# Patient Record
Sex: Female | Born: 1965 | Race: White | Hispanic: No | Smoking: Current every day smoker
Health system: Southern US, Community
[De-identification: ages and names within clinical notes are randomized; demographics above are authoritative.]

## PROBLEM LIST (undated history)

## (undated) DIAGNOSIS — E079 Disorder of thyroid, unspecified: Secondary | ICD-10-CM

## (undated) DIAGNOSIS — Z973 Presence of spectacles and contact lenses: Secondary | ICD-10-CM

## (undated) HISTORY — PX: CRYOTHERAPY: SHX1416

## (undated) HISTORY — PX: COLONOSCOPY: SHX174

## (undated) HISTORY — DX: Disorder of thyroid, unspecified: E07.9

---

## 2010-11-06 ENCOUNTER — Ambulatory Visit: Payer: Self-pay | Admitting: Internal Medicine

## 2011-11-05 ENCOUNTER — Ambulatory Visit: Payer: Self-pay | Admitting: Otolaryngology

## 2011-11-05 LAB — T4, FREE: Free Thyroxine: 0.87 ng/dL (ref 0.76–1.46)

## 2011-11-06 ENCOUNTER — Ambulatory Visit: Payer: Self-pay | Admitting: Otolaryngology

## 2012-02-03 ENCOUNTER — Ambulatory Visit: Payer: Self-pay | Admitting: Otolaryngology

## 2012-02-03 LAB — T4, FREE: Free Thyroxine: 0.98 ng/dL (ref 0.76–1.46)

## 2012-05-12 ENCOUNTER — Ambulatory Visit: Payer: Self-pay | Admitting: Otolaryngology

## 2012-05-12 LAB — TSH: Thyroid Stimulating Horm: 12.8 u[IU]/mL — ABNORMAL HIGH

## 2012-05-12 LAB — T4, FREE: Free Thyroxine: 1.24 ng/dL (ref 0.76–1.46)

## 2012-06-24 ENCOUNTER — Ambulatory Visit: Payer: Self-pay | Admitting: Otolaryngology

## 2012-06-24 LAB — T4, FREE: Free Thyroxine: 1.21 ng/dL (ref 0.76–1.46)

## 2012-08-25 ENCOUNTER — Ambulatory Visit: Payer: Self-pay | Admitting: Otolaryngology

## 2012-08-25 LAB — T4, FREE: Free Thyroxine: 1.37 ng/dL (ref 0.76–1.46)

## 2012-08-25 LAB — TSH: Thyroid Stimulating Horm: 1.15 u[IU]/mL

## 2013-03-10 ENCOUNTER — Ambulatory Visit: Payer: Self-pay | Admitting: Otolaryngology

## 2013-03-10 LAB — T4, FREE: Free Thyroxine: 1.17 ng/dL (ref 0.76–1.46)

## 2013-06-15 ENCOUNTER — Ambulatory Visit: Payer: Self-pay | Admitting: Otolaryngology

## 2013-06-15 LAB — TSH: Thyroid Stimulating Horm: 7.9 u[IU]/mL — ABNORMAL HIGH

## 2013-06-15 LAB — T4, FREE: Free Thyroxine: 1.17 ng/dL (ref 0.76–1.46)

## 2013-09-06 IMAGING — US US THYROID
1 series · 17 of 25 positions shown · non-contrast
Comparison: none

REASON FOR EXAM: thyroid nodule
COMMENTS:

[Series 1: us thyroid · 17 of 55 slices shown]
[im 1/55]
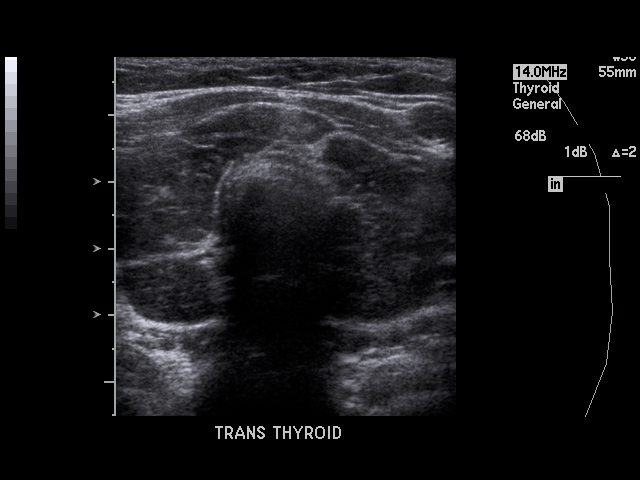
[im 5/55]
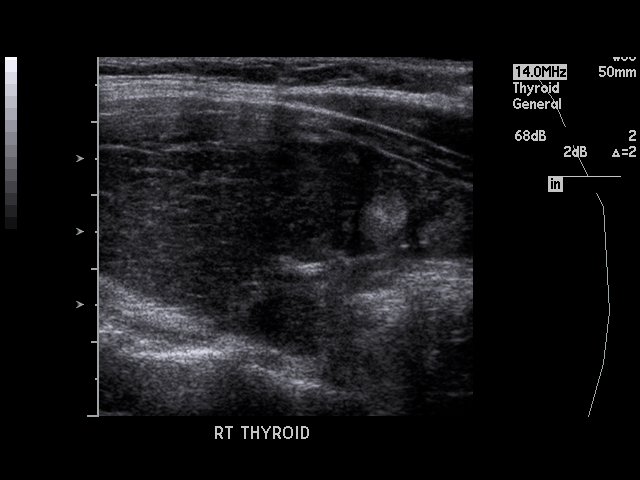
[im 7/55]
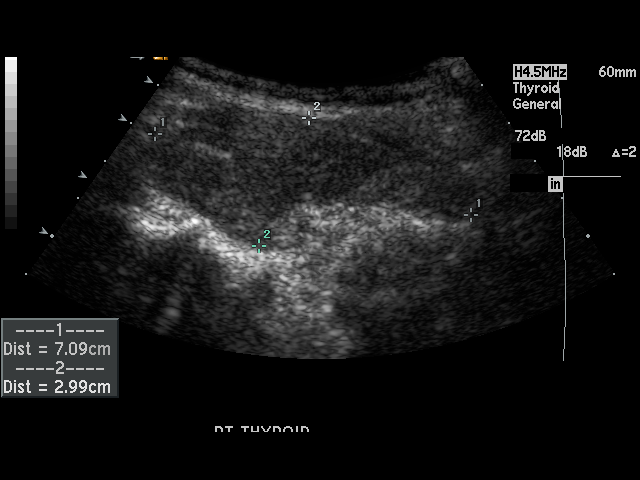
[im 12/55]
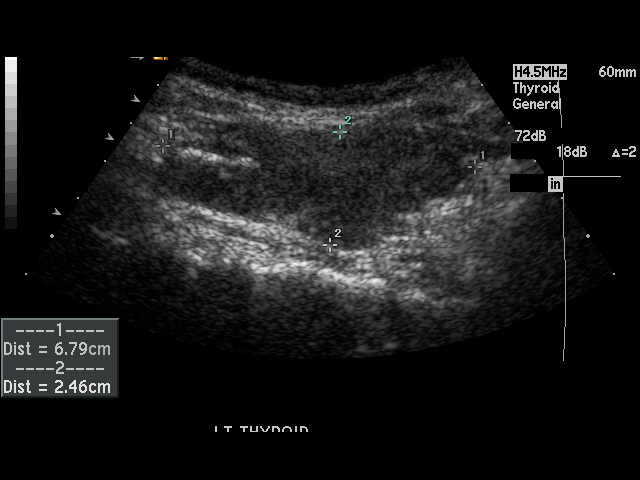
[im 14/55]
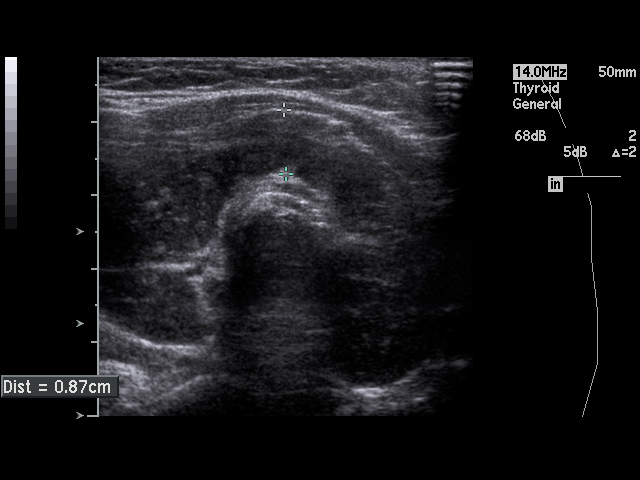
[im 19/55]
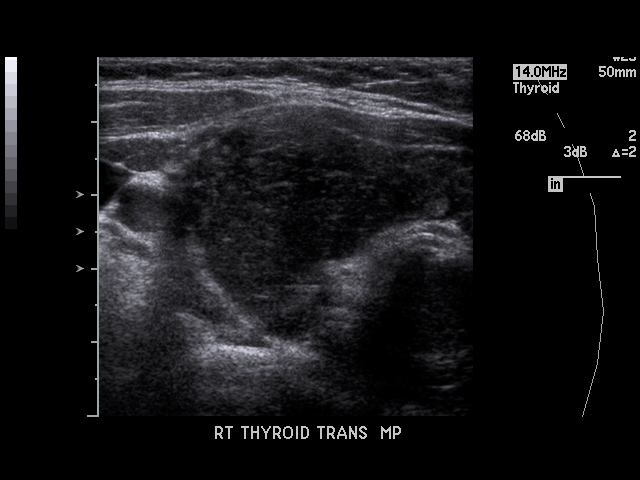
[im 21/55]
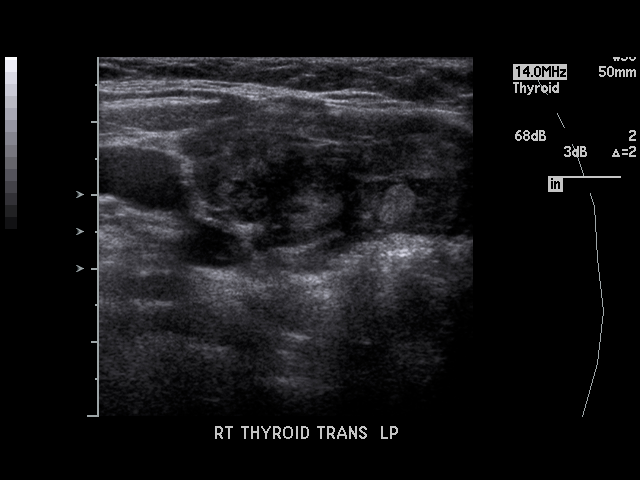
[im 25/55]
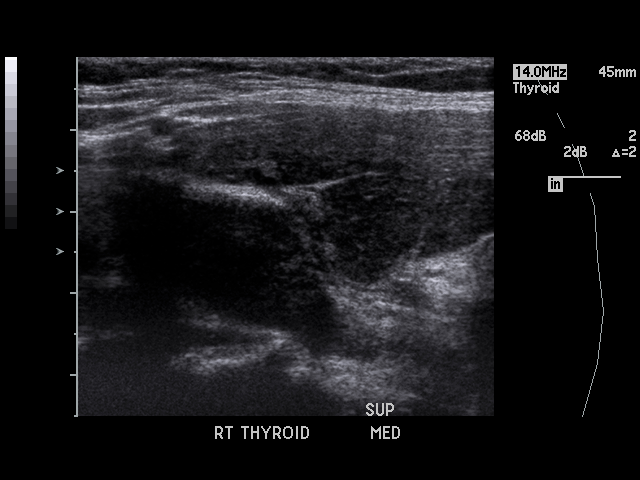
[im 28/55]
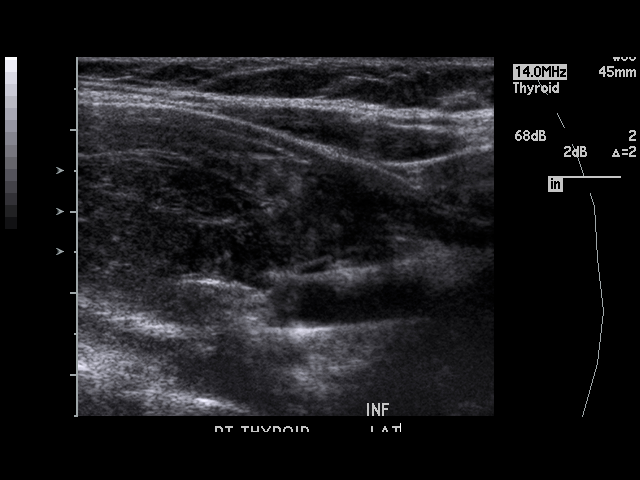
[im 30/55]
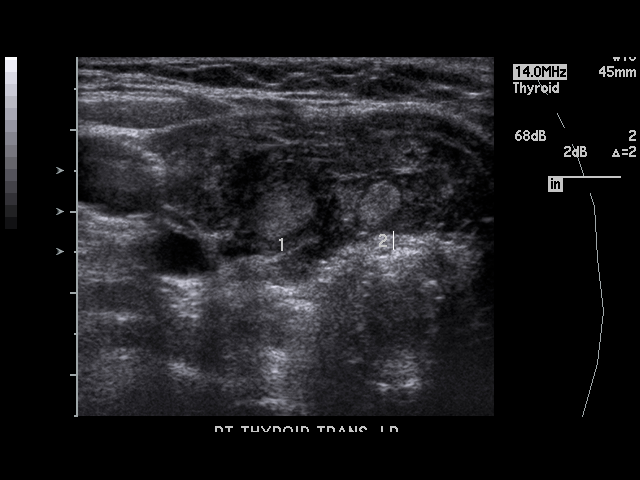
[im 34/55]
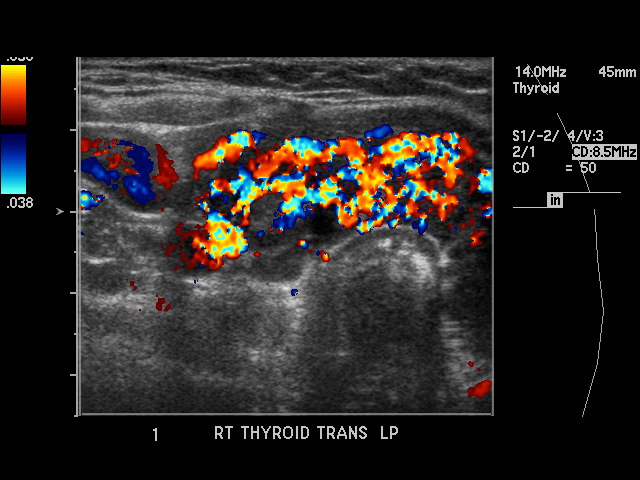
[im 37/55]
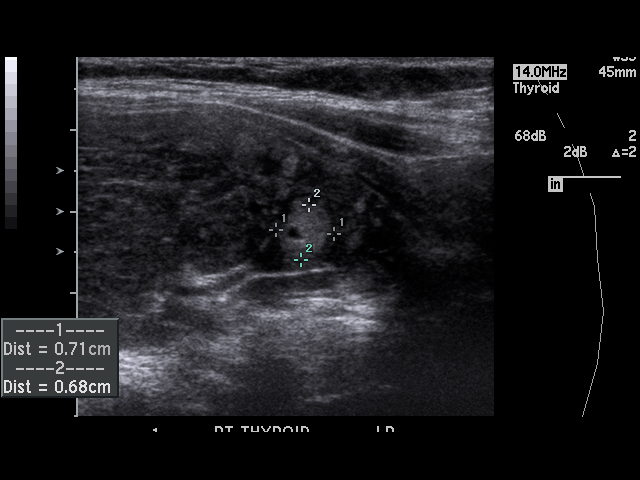
[im 41/55]
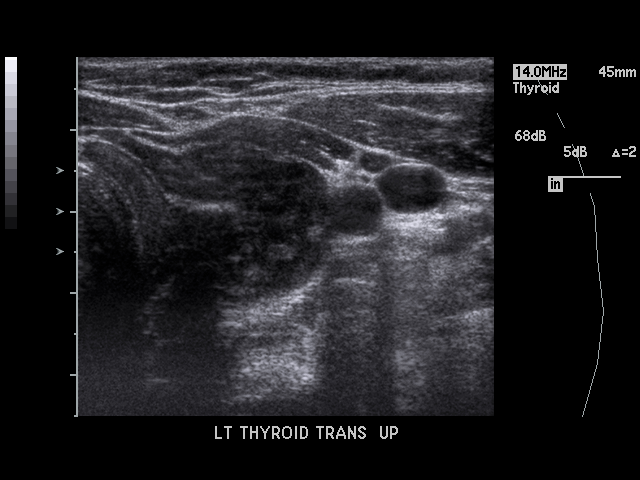
[im 43/55]
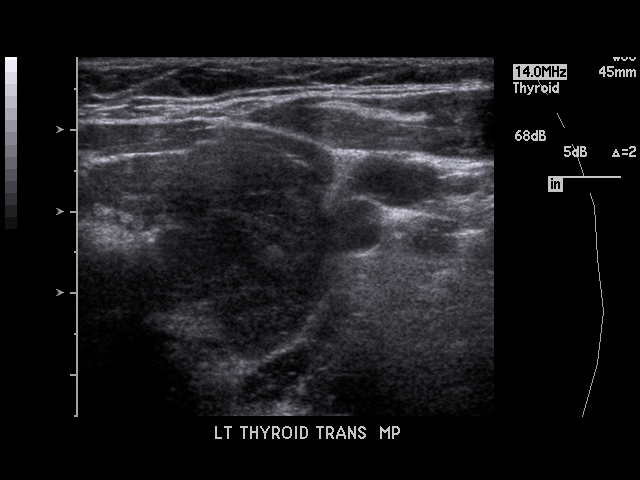
[im 48/55]
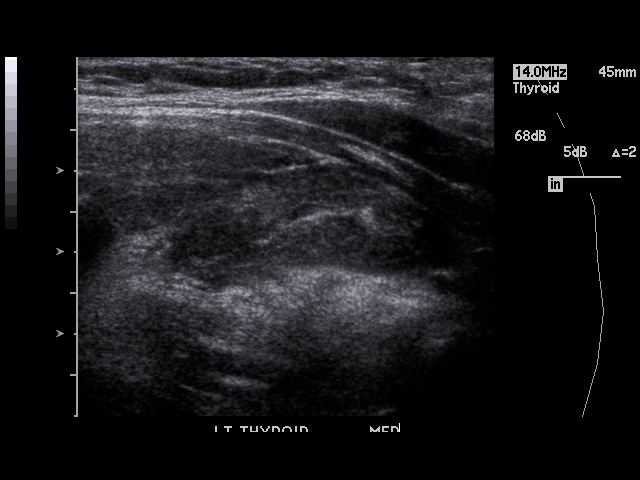
[im 50/55]
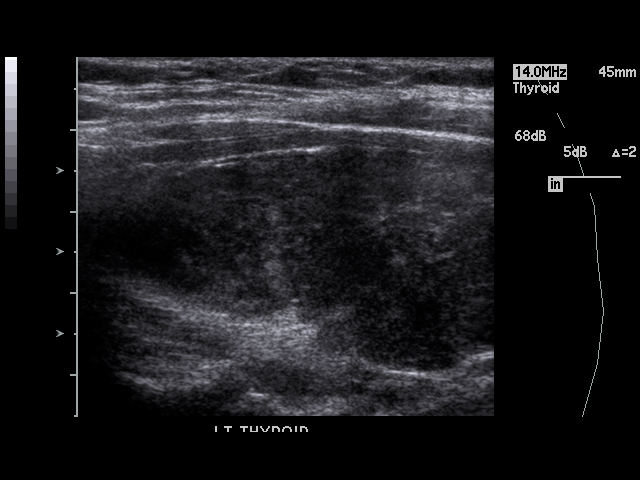
[im 55/55]
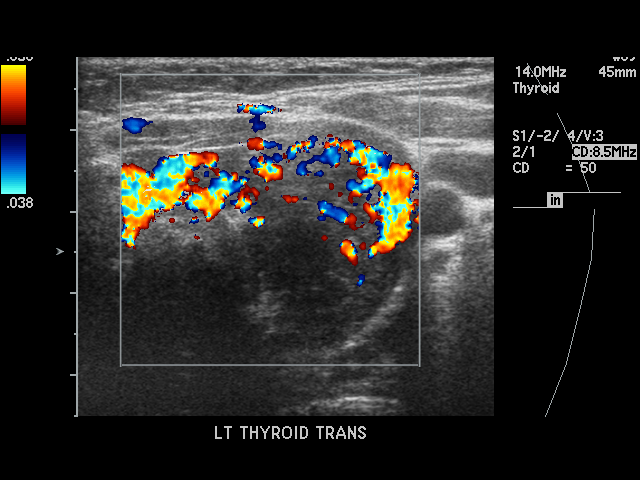

[17 of 25 positions shown; findings below may reference images not displayed]

PROCEDURE:     PAULO CARLOS - PAULO CARLOS THYROID  - November 06, 2011  [DATE]

RESULT:     Ultrasound of the thyroid demonstrates an enlarged, slightly
hypoechoic, heterogeneous hypervascular gland. The right lobe contains
multiple more hyperechoic nodules than the surrounding parenchyma. The
largest is in the lower pole of the right lobe and has a slightly hypoechoic
center. The largest nodule measures 0.71 x 0.68 x 0.90 cm. The isthmus is
hypervascular without a discrete mass. The left lobe is also hypervascular
and heterogeneous. No simple cysts are seen. The right lobe measures 7.09 x
2.99 x 2.68 cm. The left lobe measures 6.79 x 2.48 x 1.88 cm. The isthmus
shows an anterior to posterior dimension of 8.7 mm.
IMPRESSION: Abnormal thyroid ultrasound with thyromegaly and
hypervascularity diffusely which could represent thyroiditis. Nodular
densities in the right lobe are present with the largest in the lower pole
region as discussed above. Clinical and laboratory correlation is
recommended. No definite left thyroid mass is seen.

## 2013-09-22 ENCOUNTER — Ambulatory Visit: Payer: Self-pay | Admitting: Otolaryngology

## 2013-09-22 LAB — TSH: Thyroid Stimulating Horm: 3.35 u[IU]/mL

## 2013-09-22 LAB — T4, FREE: Free Thyroxine: 1.38 ng/dL (ref 0.76–1.46)

## 2013-12-31 ENCOUNTER — Ambulatory Visit: Payer: Self-pay | Admitting: Otolaryngology

## 2013-12-31 LAB — T4, FREE: Free Thyroxine: 1.57 ng/dL — ABNORMAL HIGH (ref 0.76–1.46)

## 2013-12-31 LAB — TSH: Thyroid Stimulating Horm: 0.105 u[IU]/mL — ABNORMAL LOW

## 2014-06-28 ENCOUNTER — Ambulatory Visit: Payer: Self-pay | Admitting: Otolaryngology

## 2014-06-28 LAB — T4, FREE: Free Thyroxine: 1.88 ng/dL — ABNORMAL HIGH (ref 0.76–1.46)

## 2014-06-28 LAB — TSH: Thyroid Stimulating Horm: 0.017 u[IU]/mL — ABNORMAL LOW

## 2014-07-11 ENCOUNTER — Ambulatory Visit: Payer: Self-pay | Admitting: Physician Assistant

## 2014-09-09 ENCOUNTER — Ambulatory Visit: Payer: Self-pay | Admitting: Otolaryngology

## 2014-09-09 LAB — T4, FREE: Free Thyroxine: 1.55 ng/dL — ABNORMAL HIGH (ref 0.76–1.46)

## 2014-09-09 LAB — TSH: THYROID STIMULATING HORM: 1.04 u[IU]/mL

## 2015-01-26 ENCOUNTER — Ambulatory Visit
Admit: 2015-01-26 | Disposition: A | Discharge status: Home / Self Care | Payer: Self-pay | Attending: Otolaryngology | Admitting: Otolaryngology

## 2015-01-26 LAB — TSH: Thyroid Stimulating Horm: 0.112 u[IU]/mL — ABNORMAL LOW

## 2015-01-26 LAB — T4, FREE: Free Thyroxine: 1.61 ng/dL — ABNORMAL HIGH

## 2015-05-29 ENCOUNTER — Other Ambulatory Visit
Admission: RE | Admit: 2015-05-29 | Discharge: 2015-05-29 | Disposition: A | Discharge status: Home / Self Care | Payer: BLUE CROSS/BLUE SHIELD | Source: Ambulatory Visit | Attending: Otolaryngology | Admitting: Otolaryngology

## 2015-05-29 DIAGNOSIS — E039 Hypothyroidism, unspecified: Secondary | ICD-10-CM | POA: Diagnosis not present

## 2015-05-29 DIAGNOSIS — E041 Nontoxic single thyroid nodule: Secondary | ICD-10-CM | POA: Diagnosis present

## 2015-05-29 LAB — T4, FREE: FREE T4: 1.41 ng/dL — AB (ref 0.61–1.12)

## 2015-05-29 LAB — TSH: TSH: 0.288 u[IU]/mL — ABNORMAL LOW (ref 0.350–4.500)

## 2015-05-30 LAB — T3, FREE: T3 FREE: 2.8 pg/mL (ref 2.0–4.4)

## 2016-02-09 ENCOUNTER — Other Ambulatory Visit
Admission: RE | Admit: 2016-02-09 | Discharge: 2016-02-09 | Disposition: A | Discharge status: Home / Self Care | Payer: BLUE CROSS/BLUE SHIELD | Source: Ambulatory Visit | Attending: Otolaryngology | Admitting: Otolaryngology

## 2016-02-09 DIAGNOSIS — E039 Hypothyroidism, unspecified: Secondary | ICD-10-CM | POA: Insufficient documentation

## 2016-02-09 LAB — T4, FREE: Free T4: 0.79 ng/dL (ref 0.61–1.12)

## 2016-02-09 LAB — TSH: TSH: 42.235 u[IU]/mL — AB (ref 0.350–4.500)

## 2016-02-10 LAB — T3, FREE: T3, Free: 1.9 pg/mL — ABNORMAL LOW (ref 2.0–4.4)

## 2016-05-08 ENCOUNTER — Other Ambulatory Visit
Admission: RE | Admit: 2016-05-08 | Discharge: 2016-05-08 | Disposition: A | Discharge status: Home / Self Care | Payer: BLUE CROSS/BLUE SHIELD | Source: Ambulatory Visit | Attending: Otolaryngology | Admitting: Otolaryngology

## 2016-05-08 DIAGNOSIS — E039 Hypothyroidism, unspecified: Secondary | ICD-10-CM | POA: Diagnosis not present

## 2016-05-08 LAB — TSH: TSH: 4.374 u[IU]/mL (ref 0.350–4.500)

## 2016-05-08 LAB — T4, FREE: FREE T4: 1.21 ng/dL — AB (ref 0.61–1.12)

## 2016-05-09 LAB — T3, FREE: T3 FREE: 2.4 pg/mL (ref 2.0–4.4)

## 2016-10-31 DIAGNOSIS — E039 Hypothyroidism, unspecified: Secondary | ICD-10-CM | POA: Diagnosis not present

## 2016-11-13 DIAGNOSIS — T169XXA Foreign body in ear, unspecified ear, initial encounter: Secondary | ICD-10-CM | POA: Diagnosis not present

## 2016-11-13 DIAGNOSIS — E039 Hypothyroidism, unspecified: Secondary | ICD-10-CM | POA: Diagnosis not present

## 2017-03-18 DIAGNOSIS — E039 Hypothyroidism, unspecified: Secondary | ICD-10-CM | POA: Diagnosis not present

## 2017-03-26 DIAGNOSIS — E039 Hypothyroidism, unspecified: Secondary | ICD-10-CM | POA: Diagnosis not present

## 2017-06-24 DIAGNOSIS — E039 Hypothyroidism, unspecified: Secondary | ICD-10-CM | POA: Diagnosis not present

## 2017-06-26 DIAGNOSIS — E039 Hypothyroidism, unspecified: Secondary | ICD-10-CM | POA: Diagnosis not present

## 2017-07-31 DIAGNOSIS — Z23 Encounter for immunization: Secondary | ICD-10-CM | POA: Diagnosis not present

## 2017-09-25 DIAGNOSIS — E039 Hypothyroidism, unspecified: Secondary | ICD-10-CM | POA: Diagnosis not present

## 2017-10-01 ENCOUNTER — Other Ambulatory Visit: Payer: Self-pay | Admitting: Otolaryngology

## 2017-10-01 DIAGNOSIS — E041 Nontoxic single thyroid nodule: Secondary | ICD-10-CM

## 2017-10-01 DIAGNOSIS — E039 Hypothyroidism, unspecified: Secondary | ICD-10-CM | POA: Diagnosis not present

## 2017-12-22 ENCOUNTER — Ambulatory Visit
Admission: RE | Admit: 2017-12-22 | Discharge: 2017-12-22 | Disposition: A | Discharge status: Home / Self Care | Payer: BLUE CROSS/BLUE SHIELD | Source: Ambulatory Visit | Attending: Otolaryngology | Admitting: Otolaryngology

## 2017-12-22 DIAGNOSIS — E010 Iodine-deficiency related diffuse (endemic) goiter: Secondary | ICD-10-CM | POA: Diagnosis not present

## 2017-12-22 DIAGNOSIS — E041 Nontoxic single thyroid nodule: Secondary | ICD-10-CM

## 2017-12-24 DIAGNOSIS — E041 Nontoxic single thyroid nodule: Secondary | ICD-10-CM | POA: Diagnosis not present

## 2017-12-30 DIAGNOSIS — E041 Nontoxic single thyroid nodule: Secondary | ICD-10-CM | POA: Diagnosis not present

## 2017-12-30 DIAGNOSIS — E039 Hypothyroidism, unspecified: Secondary | ICD-10-CM | POA: Diagnosis not present

## 2018-04-02 DIAGNOSIS — E039 Hypothyroidism, unspecified: Secondary | ICD-10-CM | POA: Diagnosis not present

## 2018-04-08 DIAGNOSIS — E039 Hypothyroidism, unspecified: Secondary | ICD-10-CM | POA: Diagnosis not present

## 2018-04-08 DIAGNOSIS — E041 Nontoxic single thyroid nodule: Secondary | ICD-10-CM | POA: Diagnosis not present

## 2018-07-10 DIAGNOSIS — Z1231 Encounter for screening mammogram for malignant neoplasm of breast: Secondary | ICD-10-CM | POA: Diagnosis not present

## 2018-07-31 DIAGNOSIS — Z23 Encounter for immunization: Secondary | ICD-10-CM | POA: Diagnosis not present

## 2018-08-03 ENCOUNTER — Other Ambulatory Visit: Payer: Self-pay | Admitting: Obstetrics & Gynecology

## 2018-08-03 NOTE — Progress Notes (Signed)
MMG reviewed from [facility], normal (done 07/10/18) Barnett Applebaum, MD, [facility], [facility] 08/03/2018  5:21 PM

## 2018-08-04 ENCOUNTER — Telehealth: Payer: Self-pay | Admitting: Obstetrics & Gynecology

## 2018-08-04 NOTE — Telephone Encounter (Signed)
-----   Message from Gae Dry, MD sent at 08/03/2018  5:20 PM EDT ----- Regarding: Annual [place] please

## 2018-08-04 NOTE — Telephone Encounter (Signed)
Spoke with patient to schedule annual. Patient is wanting to check with her work schedule and call back to be schedule.

## 2018-09-16 ENCOUNTER — Encounter: Payer: Self-pay | Admitting: Obstetrics & Gynecology

## 2018-09-16 ENCOUNTER — Ambulatory Visit (INDEPENDENT_AMBULATORY_CARE_PROVIDER_SITE_OTHER): Payer: BLUE CROSS/BLUE SHIELD | Admitting: Obstetrics & Gynecology

## 2018-09-16 VITALS — BP 120/80 | Ht 69.0 in | Wt 228.0 lb

## 2018-09-16 DIAGNOSIS — Z1321 Encounter for screening for nutritional disorder: Secondary | ICD-10-CM | POA: Diagnosis not present

## 2018-09-16 DIAGNOSIS — Z13220 Encounter for screening for lipoid disorders: Secondary | ICD-10-CM

## 2018-09-16 DIAGNOSIS — Z01419 Encounter for gynecological examination (general) (routine) without abnormal findings: Secondary | ICD-10-CM

## 2018-09-16 DIAGNOSIS — Z1211 Encounter for screening for malignant neoplasm of colon: Secondary | ICD-10-CM

## 2018-09-16 DIAGNOSIS — Z0000 Encounter for general adult medical examination without abnormal findings: Secondary | ICD-10-CM

## 2018-09-16 DIAGNOSIS — Z131 Encounter for screening for diabetes mellitus: Secondary | ICD-10-CM

## 2018-09-16 NOTE — Patient Instructions (Signed)
PAP every three years Mammogram every year Colonoscopy every 5 years Labs yearly

## 2018-09-16 NOTE — Progress Notes (Signed)
HPI:      [name] is a 52 y.o. G1P1001 who LMP was in the past, she presents today for her annual examination.  The patient has no complaints today. The patient is sexually active. Herlast pap: approximate date 2017 and was normal and last mammogram: approximate date 06/2018 and was normal.  The patient does perform self breast exams.  There is no notable family history of breast or ovarian cancer in her family. The patient is not taking hormone replacement therapy. Patient denies post-menopausal vaginal bleeding.   The patient has regular exercise: yes. The patient denies current symptoms of depression.    GYN Hx: Last Colonoscopy:5 years ago. Polyps.   PMHx: Past Medical History:  Diagnosis Date  . Thyroid disease    Past Surgical History:  Procedure Laterality Date  . CRYOTHERAPY     Family History  Problem Relation Age of Onset  . Hypertension Maternal Grandfather   . Diabetes Maternal Grandfather   . Cancer Paternal Grandmother    Social History   Tobacco Use  . Smoking status: Current Every Day Smoker  . Smokeless tobacco: Never Used  Substance Use Topics  . Alcohol use: Yes  . Drug use: Never    Current Outpatient Medications:  .  levothyroxine (SYNTHROID, LEVOTHROID) 125 MCG tablet, TAKE ONE TABLET EVERY OTHER DAY (ALTERNATING WITH LEVOTHYROXINE 137 MCG), Disp: , Rfl: 1 Allergies: Patient has no known allergies.  Review of Systems  Constitutional: Negative for chills, fever and malaise/fatigue.  HENT: Negative for congestion, sinus pain and sore throat.   Eyes: Negative for blurred vision and pain.  Respiratory: Negative for cough and wheezing.   Cardiovascular: Negative for chest pain and leg swelling.  Gastrointestinal: Negative for abdominal pain, constipation, diarrhea, heartburn, nausea and vomiting.  Genitourinary: Negative for dysuria, frequency, hematuria and urgency.  Musculoskeletal: Negative for back pain, joint pain, myalgias and neck  pain.  Skin: Negative for itching and rash.  Neurological: Negative for dizziness, tremors and weakness.  Endo/Heme/Allergies: Does not bruise/bleed easily.  Psychiatric/Behavioral: Negative for depression. The patient is not nervous/anxious and does not have insomnia.     Objective: BP 120/80   Ht 5\' 9"  (1.753 m)   Wt 228 lb (103.4 kg)   BMI 33.67 kg/m   Filed Weights   09/16/18 0915  Weight: 228 lb (103.4 kg)   Body mass index is 33.67 kg/m. Physical Exam  Constitutional: She is oriented to person, place, and time. She appears well-developed and well-nourished. No distress.  Genitourinary: Rectum normal, vagina normal and uterus normal. Pelvic exam was performed with patient supine. There is no rash or lesion on the right labia. There is no rash or lesion on the left labia. Vagina exhibits no lesion. No bleeding in the vagina. Right adnexum does not display mass and does not display tenderness. Left adnexum does not display mass and does not display tenderness. Cervix does not exhibit motion tenderness, lesion, friability or polyp.   Uterus is mobile and midaxial. Uterus is not enlarged or exhibiting a mass.  HENT:  Head: Normocephalic and atraumatic. Head is without laceration.  Right Ear: Hearing normal.  Left Ear: Hearing normal.  Nose: No epistaxis.  No foreign bodies.  Mouth/Throat: Uvula is midline, oropharynx is clear and moist and mucous membranes are normal.  Eyes: Pupils are equal, round, and reactive to light.  Neck: Normal range of motion. Neck supple. No thyromegaly present.  Cardiovascular: Normal rate and regular rhythm. Exam reveals no gallop and  no friction rub.  No murmur heard. Pulmonary/Chest: Effort normal and breath sounds normal. No respiratory distress. She has no wheezes. Right breast exhibits no mass, no skin change and no tenderness. Left breast exhibits no mass, no skin change and no tenderness.  Abdominal: Soft. Bowel sounds are normal. She exhibits no  distension. There is no tenderness. There is no rebound.  Musculoskeletal: Normal range of motion.  Neurological: She is alert and oriented to person, place, and time. No cranial nerve deficit.  Skin: Skin is warm and dry.  Psychiatric: She has a normal mood and affect. Judgment normal.  Vitals reviewed.  Assessment: Annual Exam 1. Annual physical exam   2. Screening for diabetes mellitus   3. Screening for cholesterol level   4. Encounter for vitamin deficiency screening   5. Screen for colon cancer    Plan:            1.  Cervical Screening-  Pap smear schedule reviewed with patient, due 2020  2. Breast screening- Exam annually and mammogram UTD  3. Colonoscopy every 5 years due to polyp history w colonoscopy for rectal bleeding 4-5 years ago.  Will arrange consult  4. Labs Ordered today  5. Counseling for hormonal therapy: no change in therapy today  6. Flu shot UTD    F/U  Return in about 1 year (around 09/17/2019) for Annual.  Barnett Applebaum, MD, [facility], [facility] 09/16/2018  9:39 AM

## 2018-09-17 LAB — LIPID PANEL
CHOL/HDL RATIO: 3.9 ratio (ref 0.0–4.4)
CHOLESTEROL TOTAL: 199 mg/dL (ref 100–199)
HDL: 51 mg/dL (ref >39)
LDL Calculated: 112 mg/dL — ABNORMAL HIGH (ref 0–99)
TRIGLYCERIDES: 178 mg/dL — AB (ref 0–149)
VLDL CHOLESTEROL CAL: 36 mg/dL (ref 5–40)

## 2018-09-17 LAB — VITAMIN D 25 HYDROXY (VIT D DEFICIENCY, FRACTURES): Vit D, 25-Hydroxy: 24.9 ng/mL — ABNORMAL LOW (ref 30.0–100.0)

## 2018-09-17 LAB — HEMOGLOBIN A1C
Est. average glucose Bld gHb Est-mCnc: 128 mg/dL
Hgb A1c MFr Bld: 6.1 % — ABNORMAL HIGH (ref 4.8–5.6)

## 2018-09-19 ENCOUNTER — Encounter: Payer: Self-pay | Admitting: Obstetrics & Gynecology

## 2018-09-29 ENCOUNTER — Other Ambulatory Visit: Payer: Self-pay

## 2018-09-29 DIAGNOSIS — Z1211 Encounter for screening for malignant neoplasm of colon: Secondary | ICD-10-CM

## 2018-10-05 ENCOUNTER — Encounter: Payer: Self-pay | Admitting: *Deleted

## 2018-10-05 ENCOUNTER — Other Ambulatory Visit: Payer: Self-pay

## 2018-10-09 NOTE — Discharge Instructions (Signed)
General Anesthesia, Adult, Care After  This sheet gives you information about how to care for yourself after your procedure. Your health care provider may also give you more specific instructions. If you have problems or questions, contact your health care provider.  What can I expect after the procedure?  After the procedure, the following side effects are common:  Pain or discomfort at the IV site.  Nausea.  Vomiting.  Sore throat.  Trouble concentrating.  Feeling cold or chills.  Weak or tired.  Sleepiness and fatigue.  Soreness and body aches. These side effects can affect parts of the body that were not involved in surgery.  Follow these instructions at home:    For at least 24 hours after the procedure:  Have a responsible adult stay with you. It is important to have someone help care for you until you are awake and alert.  Rest as needed.  Do not:  Participate in activities in which you could fall or become injured.  Drive.  Use heavy machinery.  Drink alcohol.  Take sleeping pills or medicines that cause drowsiness.  Make important decisions or sign legal documents.  Take care of children on your own.  Eating and drinking  Follow any instructions from your health care provider about eating or drinking restrictions.  When you feel hungry, start by eating small amounts of foods that are soft and easy to digest (bland), such as toast. Gradually return to your regular diet.  Drink enough fluid to keep your urine pale yellow.  If you vomit, rehydrate by drinking water, juice, or clear broth.  General instructions  If you have sleep apnea, surgery and certain medicines can increase your risk for breathing problems. Follow instructions from your health care provider about wearing your sleep device:  Anytime you are sleeping, including during daytime naps.  While taking prescription pain medicines, sleeping medicines, or medicines that make you drowsy.  Return to your normal activities as told by your health care  provider. Ask your health care provider what activities are safe for you.  Take over-the-counter and prescription medicines only as told by your health care provider.  If you smoke, do not smoke without supervision.  Keep all follow-up visits as told by your health care provider. This is important.  Contact a health care provider if:  You have nausea or vomiting that does not get better with medicine.  You cannot eat or drink without vomiting.  You have pain that does not get better with medicine.  You are unable to pass urine.  You develop a skin rash.  You have a fever.  You have redness around your IV site that gets worse.  Get help right away if:  You have difficulty breathing.  You have chest pain.  You have blood in your urine or stool, or you vomit blood.  Summary  After the procedure, it is common to have a sore throat or nausea. It is also common to feel tired.  Have a responsible adult stay with you for the first 24 hours after general anesthesia. It is important to have someone help care for you until you are awake and alert.  When you feel hungry, start by eating small amounts of foods that are soft and easy to digest (bland), such as toast. Gradually return to your regular diet.  Drink enough fluid to keep your urine pale yellow.  Return to your normal activities as told by your health care provider. Ask your health care   provider what activities are safe for you.  This information is not intended to replace advice given to you by your health care provider. Make sure you discuss any questions you have with your health care provider.  Document Released: 01/13/2001 Document Revised: 05/23/2017 Document Reviewed: 05/23/2017  Elsevier Interactive Patient Education  2019 [employer] Inc[employer]

## 2018-10-12 ENCOUNTER — Ambulatory Visit
Admission: RE | Admit: 2018-10-12 | Discharge: 2018-10-12 | Disposition: A | Discharge status: Home / Self Care | Payer: BLUE CROSS/BLUE SHIELD | Source: Ambulatory Visit | Attending: Gastroenterology | Admitting: Gastroenterology

## 2018-10-12 ENCOUNTER — Ambulatory Visit: Payer: BLUE CROSS/BLUE SHIELD | Admitting: Anesthesiology

## 2018-10-12 ENCOUNTER — Encounter
Admission: RE | Disposition: A | Discharge status: Home / Self Care | Payer: Self-pay | Source: Ambulatory Visit | Attending: Gastroenterology

## 2018-10-12 DIAGNOSIS — Z79899 Other long term (current) drug therapy: Secondary | ICD-10-CM | POA: Diagnosis not present

## 2018-10-12 DIAGNOSIS — D125 Benign neoplasm of sigmoid colon: Secondary | ICD-10-CM | POA: Diagnosis not present

## 2018-10-12 DIAGNOSIS — K5730 Diverticulosis of large intestine without perforation or abscess without bleeding: Secondary | ICD-10-CM | POA: Insufficient documentation

## 2018-10-12 DIAGNOSIS — Z860100 Personal history of colon polyps, unspecified: Secondary | ICD-10-CM

## 2018-10-12 DIAGNOSIS — E079 Disorder of thyroid, unspecified: Secondary | ICD-10-CM | POA: Diagnosis not present

## 2018-10-12 DIAGNOSIS — Z1211 Encounter for screening for malignant neoplasm of colon: Secondary | ICD-10-CM | POA: Insufficient documentation

## 2018-10-12 DIAGNOSIS — D124 Benign neoplasm of descending colon: Secondary | ICD-10-CM

## 2018-10-12 DIAGNOSIS — K635 Polyp of colon: Secondary | ICD-10-CM | POA: Diagnosis not present

## 2018-10-12 DIAGNOSIS — F17210 Nicotine dependence, cigarettes, uncomplicated: Secondary | ICD-10-CM | POA: Diagnosis not present

## 2018-10-12 HISTORY — PX: POLYPECTOMY: SHX5525

## 2018-10-12 HISTORY — DX: Presence of spectacles and contact lenses: Z97.3

## 2018-10-12 HISTORY — PX: COLONOSCOPY WITH PROPOFOL: SHX5780

## 2018-10-12 SURGERY — COLONOSCOPY WITH PROPOFOL
Anesthesia: General | Site: Rectum

## 2018-10-12 MED ORDER — LIDOCAINE HCL (CARDIAC) PF 100 MG/5ML IV SOSY
PREFILLED_SYRINGE | INTRAVENOUS | Status: DC | PRN
  Administered 2018-10-12: 40 mg via INTRAVENOUS

## 2018-10-12 MED ORDER — SODIUM CHLORIDE 0.9 % IV SOLN: INTRAVENOUS | Status: DC

## 2018-10-12 MED ORDER — LACTATED RINGERS IV SOLN
INTRAVENOUS | Status: DC
  Administered 2018-10-12: 08:00:00 via INTRAVENOUS

## 2018-10-12 MED ORDER — STERILE WATER FOR IRRIGATION IR SOLN
Status: DC | PRN
  Administered 2018-10-12: 10:00:00

## 2018-10-12 MED ORDER — OXYCODONE HCL 5 MG PO TABS: 5.0000 mg | ORAL_TABLET | Freq: Once | ORAL | Status: DC | PRN

## 2018-10-12 MED ORDER — OXYCODONE HCL 5 MG/5ML PO SOLN: 5.0000 mg | Freq: Once | ORAL | Status: DC | PRN

## 2018-10-12 MED ORDER — PROPOFOL 10 MG/ML IV BOLUS
INTRAVENOUS | Status: DC | PRN
  Administered 2018-10-12: 80 mg via INTRAVENOUS
  Administered 2018-10-12: 30 mg via INTRAVENOUS
  Administered 2018-10-12 (×2): 20 mg via INTRAVENOUS
  Administered 2018-10-12 (×2): 30 mg via INTRAVENOUS
  Administered 2018-10-12 (×3): 20 mg via INTRAVENOUS
  Administered 2018-10-12: 30 mg via INTRAVENOUS
  Administered 2018-10-12: 20 mg via INTRAVENOUS

## 2018-10-12 SURGICAL SUPPLY — 24 items
CANISTER SUCT 1200ML W/VALVE (MISCELLANEOUS) ×4 IMPLANT
CLIP HMST 235XBRD CATH ROT (MISCELLANEOUS) IMPLANT
CLIP RESOLUTION 360 11X235 (MISCELLANEOUS)
ELECT REM PT RETURN 9FT ADLT (ELECTROSURGICAL)
ELECTRODE REM PT RTRN 9FT ADLT (ELECTROSURGICAL) IMPLANT
FCP ESCP3.2XJMB 240X2.8X (MISCELLANEOUS)
FORCEPS BIOP RAD 4 LRG CAP 4 (CUTTING FORCEPS) IMPLANT
FORCEPS BIOP RJ4 240 W/NDL (MISCELLANEOUS)
FORCEPS ESCP3.2XJMB 240X2.8X (MISCELLANEOUS) IMPLANT
GOWN CVR UNV OPN BCK APRN NK (MISCELLANEOUS) ×4 IMPLANT
GOWN ISOL THUMB LOOP REG UNIV (MISCELLANEOUS) ×4
INJECTOR VARIJECT VIN23 (MISCELLANEOUS) IMPLANT
KIT DEFENDO VALVE AND CONN (KITS) IMPLANT
KIT ENDO PROCEDURE OLY (KITS) ×4 IMPLANT
MARKER SPOT ENDO TATTOO 5ML (MISCELLANEOUS) IMPLANT
PROBE APC STR FIRE (PROBE) IMPLANT
RETRIEVER NET ROTH 2.5X230 LF (MISCELLANEOUS) IMPLANT
SNARE SHORT THROW 13M SML OVAL (MISCELLANEOUS) ×4 IMPLANT
SNARE SHORT THROW 30M LRG OVAL (MISCELLANEOUS) IMPLANT
SNARE SNG USE RND 15MM (INSTRUMENTS) IMPLANT
SPOT EX ENDOSCOPIC TATTOO (MISCELLANEOUS)
TRAP ETRAP POLY (MISCELLANEOUS) ×4 IMPLANT
VARIJECT INJECTOR VIN23 (MISCELLANEOUS)
WATER STERILE IRR 250ML POUR (IV SOLUTION) ×4 IMPLANT

## 2018-10-12 NOTE — Anesthesia Postprocedure Evaluation (Signed)
Anesthesia Post Note  Patient: Catherine Stout  Procedure(s) Performed: COLONOSCOPY WITH PROPOFOL (N/A Rectum) POLYPECTOMY (N/A )  Patient location during evaluation: PACU Anesthesia Type: General Level of consciousness: awake and alert Pain management: pain level controlled Vital Signs Assessment: post-procedure vital signs reviewed and stable Respiratory status: spontaneous breathing, nonlabored ventilation, respiratory function stable and patient connected to nasal cannula oxygen Cardiovascular status: blood pressure returned to baseline and stable Postop Assessment: no apparent nausea or vomiting Anesthetic complications: no    Cabell Lazenby

## 2018-10-12 NOTE — H&P (Signed)
Lucilla Lame, MD [facility] [address], [place], [place] [address] Phone:[phone] Fax : [phone]  Primary Care Physician:  [facility] [place] Primary Gastroenterologist:  Dr. Allen Norris  Pre-Procedure History & Physical: HPI:  Deajah Erkkila is a 52 y.o. female is here for an colonoscopy.   Past Medical History:  Diagnosis Date  . Thyroid disease   . Wears contact lenses     Past Surgical History:  Procedure Laterality Date  . COLONOSCOPY    . CRYOTHERAPY      Prior to Admission medications   Medication Sig Start Date End Date Taking? Authorizing Provider  Cholecalciferol (VITAMIN D3 PO) Take by mouth daily.   Yes [provider]  levothyroxine (SYNTHROID, LEVOTHROID) 125 MCG tablet TAKE ONE TABLET EVERY OTHER DAY (ALTERNATING WITH LEVOTHYROXINE 137 MCG) 06/27/18  Yes [provider]  Multiple Vitamin (MULTIVITAMIN) tablet Take 1 tablet by mouth daily.   Yes [provider]    Allergies as of 09/29/2018  . (No Known Allergies)    Family History  Problem Relation Age of Onset  . Hypertension Maternal Grandfather   . Diabetes Maternal Grandfather   . Cancer Paternal Grandmother     Social History   Socioeconomic History  . Marital status: Divorced    Spouse name: Not on file  . Number of children: Not on file  . Years of education: Not on file  . Highest education level: Not on file  Occupational History  . Not on file  Social Needs  . Financial resource strain: Not on file  . Food insecurity:    Worry: Not on file    Inability: Not on file  . Transportation needs:    Medical: Not on file    Non-medical: Not on file  Tobacco Use  . Smoking status: Current Every Day Smoker    Packs/day: 0.66    Years: 38.00    Pack years: 25.08    Types: Cigarettes  . Smokeless tobacco: Never Used  . Tobacco comment: since age 9  Substance and Sexual Activity  . Alcohol use: Yes    Alcohol/week: 4.0 standard drinks      Types: 2 Glasses of wine, 2 Standard drinks or equivalent per week  . Drug use: Never  . Sexual activity: Yes  Lifestyle  . Physical activity:    Days per week: Not on file    Minutes per session: Not on file  . Stress: Not on file  Relationships  . Social connections:    Talks on phone: Not on file    Gets together: Not on file    Attends religious service: Not on file    Active member of club or organization: Not on file    Attends meetings of clubs or organizations: Not on file    Relationship status: Not on file  . Intimate partner violence:    Fear of current or ex partner: Not on file    Emotionally abused: Not on file    Physically abused: Not on file    Forced sexual activity: Not on file  Other Topics Concern  . Not on file  Social History Narrative  . Not on file    Review of Systems: See HPI, otherwise negative ROS  Physical Exam: BP (!) 142/73   Pulse 69   Temp 97.7 F (36.5 C) (Temporal)   Resp 16   Ht 5\' 9"  (1.753 m)   Wt 101.2 kg   SpO2 98%  BMI 32.93 kg/m  General:   Alert,  pleasant and cooperative in NAD Head:  Normocephalic and atraumatic. Neck:  Supple; no masses or thyromegaly. Lungs:  Clear throughout to auscultation.    Heart:  Regular rate and rhythm. Abdomen:  Soft, nontender and nondistended. Normal bowel sounds, without guarding, and without rebound.   Neurologic:  Alert and  oriented x4;  grossly normal neurologically.  Impression/Plan: Elberta Lachapelle is here for an colonoscopy to be performed for history of colon polyps  Risks, benefits, limitations, and alternatives regarding  colonoscopy have been reviewed with the patient.  Questions have been answered.  All parties agreeable.   Lucilla Lame, MD  10/12/2018, 9:02 AM

## 2018-10-12 NOTE — Anesthesia Procedure Notes (Signed)
Performed by: Adriona Kaney, CRNA Pre-anesthesia Checklist: Patient identified, Emergency Drugs available, Suction available, Timeout performed and Patient being monitored Patient Re-evaluated:Patient Re-evaluated prior to induction Oxygen Delivery Method: Nasal cannula Placement Confirmation: positive ETCO2       

## 2018-10-12 NOTE — Op Note (Signed)
[facility] Patient Name: Catherine Stout Procedure Date: 10/12/2018 9:20 AM MRN: [id number] Account #: [id number] Date of Birth: 12-Mar-1966 Admit Type: Outpatient Age: 52 Room: [facility] OR ROOM 01 Gender: Female Note Status: Finalized Procedure:            Colonoscopy Indications:          High risk colon cancer surveillance: Personal history                        of colonic polyps Providers:            Lucilla Lame MD, MD Referring MD:         Barnett Applebaum, MD (Referring MD) Medicines:            Propofol per Anesthesia Complications:        No immediate complications. Procedure:            Pre-Anesthesia Assessment:                       - Prior to the procedure, a History and Physical was                        performed, and patient medications and allergies were                        reviewed. The patient's tolerance of previous                        anesthesia was also reviewed. The risks and benefits of                        the procedure and the sedation options and risks were                        discussed with the patient. All questions were                        answered, and informed consent was obtained. Prior                        Anticoagulants: The patient has taken no previous                        anticoagulant or antiplatelet agents. ASA Grade                        Assessment: II - A patient with mild systemic disease.                        After reviewing the risks and benefits, the patient was                        deemed in satisfactory condition to undergo the                        procedure.                       After obtaining informed consent, the colonoscope was  passed under direct vision. Throughout the procedure,                        the patient's blood pressure, pulse, and oxygen                        saturations were monitored continuously. The was   introduced through the anus and advanced to the the                        cecum, identified by appendiceal orifice and ileocecal                        valve. The colonoscopy was performed without                        difficulty. The patient tolerated the procedure well.                        The quality of the bowel preparation was excellent. Findings:      The perianal and digital rectal examinations were normal.      Three sessile polyps were found in the sigmoid colon. The polyps were 4       to 5 mm in size. These polyps were removed with a cold snare. Resection       and retrieval were complete.      A 2 mm polyp was found in the descending colon. The polyp was sessile.       The polyp was removed with a cold snare. Resection and retrieval were       complete.      Multiple small-mouthed diverticula were found in the sigmoid colon. Impression:           - Three 4 to 5 mm polyps in the sigmoid colon, removed                        with a cold snare. Resected and retrieved.                       - One 2 mm polyp in the descending colon, removed with                        a cold snare. Resected and retrieved.                       - Diverticulosis in the sigmoid colon. Recommendation:       - Discharge patient to home.                       - Resume previous diet.                       - Continue present medications.                       - Await pathology results.                       - Repeat colonoscopy in 5 years for surveillance. Procedure Code(s):    --- Professional ---  45385, Colonoscopy, flexible; with removal of tumor(s),                        polyp(s), or other lesion(s) by snare technique Diagnosis Code(s):    --- Professional ---                       Z86.010, Personal history of colonic polyps                       D12.5, Benign neoplasm of sigmoid colon                       D12.4, Benign neoplasm of descending colon CPT copyright 2018  American Medical Association. All rights reserved. The codes documented in this report are preliminary and upon coder review may  be revised to meet current compliance requirements. Lucilla Lame MD, MD 10/12/2018 9:53:36 AM This report has been signed electronically. Number of Addenda: 0 Note Initiated On: 10/12/2018 9:20 AM Scope Withdrawal Time: 0 hours 8 minutes 10 seconds  Total Procedure Duration: 0 hours 15 minutes 26 seconds       [facility]

## 2018-10-12 NOTE — Anesthesia Preprocedure Evaluation (Signed)
Anesthesia Evaluation  Patient identified by MRN, date of birth, ID band  Reviewed: NPO status   History of Anesthesia Complications Negative for: history of anesthetic complications  Airway Mallampati: II  TM Distance: >3 FB Neck ROM: full    Dental no notable dental hx.    Pulmonary Current Smoker,    Pulmonary exam normal        Cardiovascular Exercise Tolerance: Good negative cardio ROS Normal cardiovascular exam     Neuro/Psych negative neurological ROS  negative psych ROS   GI/Hepatic negative GI ROS, Neg liver ROS,   Endo/Other  Morbid obesity (bmi=33)  Renal/GU negative Renal ROS  negative genitourinary   Musculoskeletal   Abdominal   Peds  Hematology negative hematology ROS (+)   Anesthesia Other Findings   Reproductive/Obstetrics                             Anesthesia Physical Anesthesia Plan  ASA: II  Anesthesia Plan: General   Post-op Pain Management:    Induction:   PONV Risk Score and Plan:   Airway Management Planned: Natural Airway  Additional Equipment:   Intra-op Plan:   Post-operative Plan:   Informed Consent: I have reviewed the patients History and Physical, chart, labs and discussed the procedure including the risks, benefits and alternatives for the proposed anesthesia with the patient or authorized representative who has indicated his/her understanding and acceptance.     Plan Discussed with: CRNA  Anesthesia Plan Comments:         Anesthesia Quick Evaluation

## 2018-10-12 NOTE — Transfer of Care (Signed)
Immediate Anesthesia Transfer of Care Note  Patient: Catherine Stout  Procedure(s) Performed: COLONOSCOPY WITH PROPOFOL (N/A Rectum) POLYPECTOMY (N/A )  Patient Location: PACU  Anesthesia Type: General  Level of Consciousness: awake, alert  and patient cooperative  Airway and Oxygen Therapy: Patient Spontanous Breathing and Patient connected to supplemental oxygen  Post-op Assessment: Post-op Vital signs reviewed, Patient's Cardiovascular Status Stable, Respiratory Function Stable, Patent Airway and No signs of Nausea or vomiting  Post-op Vital Signs: Reviewed and stable  Complications: No apparent anesthesia complications

## 2018-10-22 ENCOUNTER — Encounter: Payer: Self-pay | Admitting: Gastroenterology

## 2018-10-30 DIAGNOSIS — E039 Hypothyroidism, unspecified: Secondary | ICD-10-CM | POA: Diagnosis not present

## 2018-11-04 DIAGNOSIS — E041 Nontoxic single thyroid nodule: Secondary | ICD-10-CM | POA: Diagnosis not present

## 2018-11-04 DIAGNOSIS — E039 Hypothyroidism, unspecified: Secondary | ICD-10-CM | POA: Diagnosis not present

## 2018-11-29 DIAGNOSIS — M5432 Sciatica, left side: Secondary | ICD-10-CM | POA: Diagnosis not present

## 2018-12-28 DIAGNOSIS — E559 Vitamin D deficiency, unspecified: Secondary | ICD-10-CM | POA: Diagnosis not present

## 2018-12-28 DIAGNOSIS — R7303 Prediabetes: Secondary | ICD-10-CM | POA: Diagnosis not present

## 2018-12-28 DIAGNOSIS — Z1159 Encounter for screening for other viral diseases: Secondary | ICD-10-CM | POA: Diagnosis not present

## 2018-12-28 DIAGNOSIS — M545 Low back pain: Secondary | ICD-10-CM | POA: Diagnosis not present

## 2018-12-28 DIAGNOSIS — Z23 Encounter for immunization: Secondary | ICD-10-CM | POA: Diagnosis not present

## 2018-12-28 DIAGNOSIS — F17200 Nicotine dependence, unspecified, uncomplicated: Secondary | ICD-10-CM | POA: Diagnosis not present

## 2018-12-28 DIAGNOSIS — R5383 Other fatigue: Secondary | ICD-10-CM | POA: Diagnosis not present

## 2019-02-26 DIAGNOSIS — E039 Hypothyroidism, unspecified: Secondary | ICD-10-CM | POA: Diagnosis not present

## 2019-03-02 DIAGNOSIS — E039 Hypothyroidism, unspecified: Secondary | ICD-10-CM | POA: Diagnosis not present

## 2019-03-02 DIAGNOSIS — E041 Nontoxic single thyroid nodule: Secondary | ICD-10-CM | POA: Diagnosis not present

## 2019-07-12 ENCOUNTER — Ambulatory Visit: Payer: Self-pay

## 2019-07-13 ENCOUNTER — Ambulatory Visit: Payer: Self-pay

## 2019-07-13 ENCOUNTER — Other Ambulatory Visit: Payer: Self-pay

## 2019-07-13 DIAGNOSIS — Z23 Encounter for immunization: Secondary | ICD-10-CM | POA: Diagnosis not present

## 2019-07-13 NOTE — Progress Notes (Signed)
     Patient ID: Catherine Stout, female    DOB: 06/14/1966, 53 y.o.   MRN: [id number]    Thank you!!  [place] Nurse Specialist [place]: [phone]  Cell:  [phone] Website: [email or web address]

## 2019-07-13 NOTE — Patient Instructions (Signed)
Influenza (Flu) Vaccine (Inactivated or Recombinant): What You Need to Know 1. Why get vaccinated? Influenza vaccine can prevent influenza (flu). Flu is a contagious disease that spreads around the [place] every year, usually between October and May. Anyone can get the flu, but it is more dangerous for some people. Infants and young children, people 55 years of age and older, pregnant women, and people with certain health conditions or a weakened immune system are at greatest risk of flu complications. Pneumonia, bronchitis, sinus infections and ear infections are examples of flu-related complications. If you have a medical condition, such as heart disease, cancer or diabetes, flu can make it worse. Flu can cause fever and chills, sore throat, muscle aches, fatigue, cough, headache, and runny or stuffy nose. Some people may have vomiting and diarrhea, though this is more common in children than adults. Each year thousands of people in the [place] die from flu, and many more are hospitalized. Flu vaccine prevents millions of illnesses and flu-related visits to the doctor each year. 2. Influenza vaccine CDC recommends everyone 6 months of age and older get vaccinated every flu season. Children 6 months through 8 years of age may need 2 doses during a single flu season. Everyone else needs only 1 dose each flu season. It takes about 2 weeks for protection to develop after vaccination. There are many flu viruses, and they are always changing. Each year a new flu vaccine is made to protect against three or four viruses that are likely to cause disease in the upcoming flu season. Even when the vaccine doesn't exactly match these viruses, it may still provide some protection. Influenza vaccine does not cause flu. Influenza vaccine may be given at the same time as other vaccines. 3. Talk with your health care provider Tell your vaccine provider if the person getting the vaccine:  Has had an  allergic reaction after a previous dose of influenza vaccine, or has any severe, life-threatening allergies.  Has ever had Guillain-Barr Syndrome (also called GBS). In some cases, your health care provider may decide to postpone influenza vaccination to a future visit. People with minor illnesses, such as a cold, may be vaccinated. People who are moderately or severely ill should usually wait until they recover before getting influenza vaccine. Your health care provider can give you more information. 4. Risks of a vaccine reaction  Soreness, redness, and swelling where shot is given, fever, muscle aches, and headache can happen after influenza vaccine.  There may be a very small increased risk of Guillain-Barr Syndrome (GBS) after inactivated influenza vaccine (the flu shot). Young children who get the flu shot along with pneumococcal vaccine (PCV13), and/or DTaP vaccine at the same time might be slightly more likely to have a seizure caused by fever. Tell your health care provider if a child who is getting flu vaccine has ever had a seizure. People sometimes faint after medical procedures, including vaccination. Tell your provider if you feel dizzy or have vision changes or ringing in the ears. As with any medicine, there is a very remote chance of a vaccine causing a severe allergic reaction, other serious injury, or death. 5. What if there is a serious problem? An allergic reaction could occur after the vaccinated person leaves the clinic. If you see signs of a severe allergic reaction (hives, swelling of the face and throat, difficulty breathing, a fast heartbeat, dizziness, or weakness), call 9-1-1 and get the person to the nearest hospital. For other signs that   concern you, call your health care provider. Adverse reactions should be reported to the Vaccine Adverse Event Reporting System (VAERS). Your health care provider will usually file this report, or you can do it yourself. Visit the  VAERS website at www.vaers.hhs.gov or call 1-800-822-7967.VAERS is only for reporting reactions, and VAERS staff do not give medical advice. 6. The National Vaccine Injury Compensation Program The National Vaccine Injury Compensation Program (VICP) is a federal program that was created to compensate people who may have been injured by certain vaccines. Visit the VICP website at www.hrsa.gov/vaccinecompensation or call 1-800-338-2382 to learn about the program and about filing a claim. There is a time limit to file a claim for compensation. 7. How can I learn more?  Ask your healthcare provider.  Call your local or state health department.  Contact the Centers for Disease Control and Prevention (CDC): ? Call 1-800-232-4636 (1-800-CDC-INFO) or ? Visit CDC's www.cdc.gov/flu Vaccine Information Statement (Interim) Inactivated Influenza Vaccine (06/04/2018) This information is not intended to replace advice given to you by your health care provider. Make sure you discuss any questions you have with your health care provider. Document Released: 08/01/2006 Document Revised: 01/26/2019 Document Reviewed: 06/08/2018 Elsevier Patient Education  2020 Elsevier Inc. Preventing Influenza, Adult Influenza, more commonly known as "the flu," is a viral infection that mainly affects the respiratory tract. The respiratory tract includes structures that help you breathe, such as the lungs, nose, and throat. The flu causes many common cold symptoms, as well as a high fever and body aches. The flu spreads easily from person to person (is contagious). The flu is most common from December through March. This is called flu season.You can catch the flu virus by:  Breathing in droplets from an infected person's cough or sneeze.  Touching something that was recently contaminated with the virus and then touching your mouth, nose, or eyes. What can I do to lower my risk?        You can decrease your risk of getting  the flu by:  Getting a flu shot (influenza vaccination) every year. This is the best way to prevent the flu. A flu shot is recommended for everyone age 6 months and older. ? It is best to get a flu shot in the fall, as soon as it is available. Getting a flu shot during winter or spring instead is still a good idea. Flu season can last into early spring. ? Preventing the flu through vaccination requires getting a new flu shot every year. This is because the flu virus changes slightly (mutates) from one year to the next. Even if a flu shot does not completely protect you from all flu virus mutations, it can reduce the severity of your illness and prevent dangerous complications of the flu. ? If you are pregnant, you can and should get a flu shot. ? If you have had a reaction to the shot in the past or if you are allergic to eggs, check with your health care provider before getting a flu shot. ? Sometimes the vaccine is available as a nasal spray. In some years, the nasal spray has not been as effective against the flu virus. Check with your health care provider if you have questions about this.  Practicing good health habits. This is especially important during flu season. ? Avoid contact with people who are sick with flu or cold symptoms. ? Wash your hands with soap and water often. If soap and water are not available, use alcohol-based   hand sanitizer. ? Avoid touching your hands to your face, especially when you have not washed your hands recently. ? Use a disinfectant to clean surfaces at home and at work that may be contaminated with the flu virus. ? Keep your body's disease-fighting system (immune system) in good shape by eating a healthy diet, drinking plenty of fluids, getting enough sleep, and exercising regularly. If you do get the flu, avoid spreading it to others by:  Staying home until your symptoms have been gone for at least one day.  Covering your mouth and nose when you cough or  sneeze.  Avoiding close contact with others, especially babies and elderly people. Why are these changes important? Getting a flu shot and practicing good health habits protects you as well as other people. If you get the flu, your friends, family, and co-workers are also at risk of getting it, because it spreads so easily to others. Each year, about 2 out of every 10 people get the flu. Having the flu can lead to complications, such as pneumonia, ear infection, and sinus infection. The flu also can be deadly, especially for babies, people older than age 55, and people who have serious long-term diseases. How is this treated? Most people recover from the flu by resting at home and drinking plenty of fluids. However, a prescription antiviral medicine may reduce your flu symptoms and may make your flu go away sooner. This medicine must be started within a few days of getting flu symptoms. You can talk with your health care provider about whether you need an antiviral medicine. Antiviral medicine may be prescribed for people who are at risk for more serious flu symptoms. This includes people who:  Are older than age 55.  Are pregnant.  Have a condition that makes the flu worse or more dangerous. Where to find more information  Centers for Disease Control and Prevention: www.cdc.gov/flu/index.htm  Flu.gov: www.flu.gov/prevention-vaccination  American Academy of [employer]icians: familydoctor.org/familydoctor/en/kids/vaccines/preventing-the-flu.html Contact a health care provider if:  You have influenza and you develop new symptoms.  You have: ? Chest pain. ? Diarrhea. ? A fever.  Your cough gets worse, or you produce more mucus. Summary  The best way to prevent the flu is to get a flu shot every year in the fall.  Even if you get the flu after you have received the yearly vaccine, your flu may be milder and go away sooner because of your flu shot.  If you get the flu, antiviral  medicines that are started with a few days of symptoms may reduce your flu symptoms and may make your flu go away sooner.  You can also help prevent the flu by practicing good health habits. This information is not intended to replace advice given to you by your health care provider. Make sure you discuss any questions you have with your health care provider. Document Released: 10/22/2015 Document Revised: 09/19/2017 Document Reviewed: 06/15/2016 Elsevier Patient Education  2020 Elsevier Inc.  

## 2019-08-11 DIAGNOSIS — Z1231 Encounter for screening mammogram for malignant neoplasm of breast: Secondary | ICD-10-CM | POA: Diagnosis not present

## 2019-08-30 DIAGNOSIS — E039 Hypothyroidism, unspecified: Secondary | ICD-10-CM | POA: Diagnosis not present

## 2019-08-31 DIAGNOSIS — E038 Other specified hypothyroidism: Secondary | ICD-10-CM | POA: Diagnosis not present

## 2019-08-31 DIAGNOSIS — E041 Nontoxic single thyroid nodule: Secondary | ICD-10-CM | POA: Diagnosis not present

## 2019-09-23 ENCOUNTER — Telehealth: Payer: Self-pay | Admitting: Obstetrics & Gynecology

## 2019-09-23 NOTE — Telephone Encounter (Signed)
Voicemail box is full - unable to leave message

## 2019-09-23 NOTE — Telephone Encounter (Signed)
-----   Message from Gae Dry, MD sent at 09/22/2019 11:30 AM EST ----- Regarding: Sch Annual [facility]

## 2019-10-29 ENCOUNTER — Encounter: Payer: Self-pay | Admitting: Obstetrics & Gynecology

## 2019-10-29 ENCOUNTER — Other Ambulatory Visit: Payer: Self-pay

## 2019-10-29 ENCOUNTER — Ambulatory Visit (INDEPENDENT_AMBULATORY_CARE_PROVIDER_SITE_OTHER): Payer: BC Managed Care – PPO | Admitting: Obstetrics & Gynecology

## 2019-10-29 ENCOUNTER — Other Ambulatory Visit (HOSPITAL_COMMUNITY)
Admission: RE | Admit: 2019-10-29 | Discharge: 2019-10-29 | Disposition: A | Discharge status: Home / Self Care | Payer: BC Managed Care – PPO | Source: Ambulatory Visit | Attending: Obstetrics & Gynecology | Admitting: Obstetrics & Gynecology

## 2019-10-29 VITALS — BP 120/80 | Ht 68.0 in | Wt 220.0 lb

## 2019-10-29 DIAGNOSIS — Z1211 Encounter for screening for malignant neoplasm of colon: Secondary | ICD-10-CM

## 2019-10-29 DIAGNOSIS — Z01419 Encounter for gynecological examination (general) (routine) without abnormal findings: Secondary | ICD-10-CM

## 2019-10-29 DIAGNOSIS — Z124 Encounter for screening for malignant neoplasm of cervix: Secondary | ICD-10-CM

## 2019-10-29 DIAGNOSIS — E559 Vitamin D deficiency, unspecified: Secondary | ICD-10-CM

## 2019-10-29 DIAGNOSIS — Z87898 Personal history of other specified conditions: Secondary | ICD-10-CM

## 2019-10-29 DIAGNOSIS — E781 Pure hyperglyceridemia: Secondary | ICD-10-CM

## 2019-10-29 NOTE — Patient Instructions (Signed)
PAP every three years Mammogram every year Colonoscopy every 5 years Labs yearly

## 2019-10-29 NOTE — Progress Notes (Signed)
HPI:      [name] is a 54 y.o. G1P1001 who LMP was in the past, she presents today for her annual examination.  The patient has no complaints today. The patient is sexually active. Herlast pap: approximate date 2017 and was normal and last mammogram: approximate date 07/2019 and was normal.  The patient does perform self breast exams.  There is no notable family history of breast or ovarian cancer in her family. The patient is not taking hormone replacement therapy. Patient denies post-menopausal vaginal bleeding.   The patient has regular exercise: yes.  She has lost 20 lbs this year. The patient denies current symptoms of depression.    GYN Hx: Last Colonoscopy:1 year ago. Normal.  Last DEXA: never ago.    PMHx: Past Medical History:  Diagnosis Date  . Thyroid disease   . Wears contact lenses    Past Surgical History:  Procedure Laterality Date  . COLONOSCOPY    . COLONOSCOPY WITH PROPOFOL N/A 10/12/2018   Procedure: COLONOSCOPY WITH PROPOFOL;  Surgeon: Lucilla Lame, MD;  Location: [facility];  Service: Endoscopy;  Laterality: N/A;  . CRYOTHERAPY    . POLYPECTOMY N/A 10/12/2018   Procedure: POLYPECTOMY;  Surgeon: Lucilla Lame, MD;  Location: [facility];  Service: Endoscopy;  Laterality: N/A;   Family History  Problem Relation Age of Onset  . Hypertension Maternal Grandfather   . Diabetes Maternal Grandfather   . Cancer Paternal Grandmother    Social History   Tobacco Use  . Smoking status: Current Every Day Smoker    Packs/day: 0.66    Years: 38.00    Pack years: 25.08    Types: Cigarettes  . Smokeless tobacco: Never Used  . Tobacco comment: since age 29  Substance Use Topics  . Alcohol use: Yes    Alcohol/week: 4.0 standard drinks    Types: 2 Glasses of wine, 2 Standard drinks or equivalent per week  . Drug use: Never    Current Outpatient Medications:  .  Cholecalciferol (VITAMIN D3 PO), Take by mouth daily., Disp: , Rfl:  .   levothyroxine (SYNTHROID, LEVOTHROID) 125 MCG tablet, TAKE ONE TABLET EVERY OTHER DAY (ALTERNATING WITH LEVOTHYROXINE 137 MCG), Disp: , Rfl: 1 .  Multiple Vitamin (MULTIVITAMIN) tablet, Take 1 tablet by mouth daily., Disp: , Rfl:  Allergies: Patient has no known allergies.  Review of Systems  Constitutional: Negative for chills, fever and malaise/fatigue.  HENT: Negative for congestion, sinus pain and sore throat.   Eyes: Negative for blurred vision and pain.  Respiratory: Negative for cough and wheezing.   Cardiovascular: Negative for chest pain and leg swelling.  Gastrointestinal: Negative for abdominal pain, constipation, diarrhea, heartburn, nausea and vomiting.  Genitourinary: Negative for dysuria, frequency, hematuria and urgency.  Musculoskeletal: Negative for back pain, joint pain, myalgias and neck pain.  Skin: Negative for itching and rash.  Neurological: Negative for dizziness, tremors and weakness.  Endo/Heme/Allergies: Does not bruise/bleed easily.  Psychiatric/Behavioral: Negative for depression. The patient is not nervous/anxious and does not have insomnia.     Objective: BP 120/80   Ht 5\' 8"  (1.727 m)   Wt 220 lb (99.8 kg)   BMI 33.45 kg/m   Filed Weights   10/29/19 0756  Weight: 220 lb (99.8 kg)   Body mass index is 33.45 kg/m. Physical Exam Constitutional:      General: She is not in acute distress.    Appearance: She is well-developed.  Genitourinary:     Pelvic  exam was performed with patient supine.     Vagina, uterus and rectum normal.     No lesions in the vagina.     No vaginal bleeding.     No cervical motion tenderness, friability, lesion or polyp.     Uterus is mobile.     Uterus is not enlarged.     No uterine mass detected.    Uterus is midaxial.     No right or left adnexal mass present.     Right adnexa not tender.     Left adnexa not tender.  HENT:     Head: Normocephalic and atraumatic. No laceration.     Right Ear: Hearing normal.      Left Ear: Hearing normal.     Mouth/Throat:     Pharynx: Uvula midline.  Eyes:     Pupils: Pupils are equal, round, and reactive to light.  Neck:     Thyroid: No thyromegaly.  Cardiovascular:     Rate and Rhythm: Normal rate and regular rhythm.     Heart sounds: No murmur. No friction rub. No gallop.   Pulmonary:     Effort: Pulmonary effort is normal. No respiratory distress.     Breath sounds: Normal breath sounds. No wheezing.  Chest:     Breasts:        Right: No mass, skin change or tenderness.        Left: No mass, skin change or tenderness.  Abdominal:     General: Bowel sounds are normal. There is no distension.     Palpations: Abdomen is soft.     Tenderness: There is no abdominal tenderness. There is no rebound.  Musculoskeletal:        General: Normal range of motion.     Cervical back: Normal range of motion and neck supple.  Neurological:     Mental Status: She is alert and oriented to person, place, and time.     Cranial Nerves: No cranial nerve deficit.  Skin:    General: Skin is warm and dry.  Psychiatric:        Judgment: Judgment normal.  Vitals reviewed.     Assessment: Annual Exam 1. Women's annual routine gynecological examination   2. Screen for colon cancer   3. Screening for cervical cancer   4. Vitamin D deficiency   5. Hypertriglyceridemia   6. History of prediabetes     Plan:            1.  Cervical Screening-  Pap smear done today  2. Breast screening- Exam annually and mammogram scheduled  3. Colonoscopy every 5 years, Hemoccult testing after age 8  4. Labs Ordered today, pertaining to above concerns from 2019 labs.  She has been taking Vit D daily and has an improved diet.  5. Counseling for hormonal therapy: none              6. FRAX - FRAX score for assessing the 10 year probability for fracture calculated and discussed today.  Based on age and score today, DEXA is not currently scheduled.    F/U  Return in about 1 year  (around 10/28/2020) for Annual.  Barnett Applebaum, MD, [facility], [facility] 10/29/2019  8:27 AM

## 2019-10-30 LAB — LIPID PANEL
Chol/HDL Ratio: 5.6 ratio — ABNORMAL HIGH (ref 0.0–4.4)
Cholesterol, Total: 222 mg/dL — ABNORMAL HIGH (ref 100–199)
HDL: 40 mg/dL (ref >39)
LDL Chol Calc (NIH): 150 mg/dL — ABNORMAL HIGH (ref 0–99)
Triglycerides: 176 mg/dL — ABNORMAL HIGH (ref 0–149)
VLDL Cholesterol Cal: 32 mg/dL (ref 5–40)

## 2019-10-30 LAB — HEMOGLOBIN A1C
Est. average glucose Bld gHb Est-mCnc: 114 mg/dL
Hgb A1c MFr Bld: 5.6 % (ref 4.8–5.6)

## 2019-10-30 LAB — VITAMIN D 25 HYDROXY (VIT D DEFICIENCY, FRACTURES): Vit D, 25-Hydroxy: 39.3 ng/mL (ref 30.0–100.0)

## 2019-11-02 ENCOUNTER — Ambulatory Visit: Payer: BLUE CROSS/BLUE SHIELD | Admitting: Obstetrics & Gynecology

## 2019-11-02 LAB — CYTOLOGY - PAP
Comment: NEGATIVE
Diagnosis: UNDETERMINED — AB
High risk HPV: NEGATIVE

## 2020-11-08 ENCOUNTER — Other Ambulatory Visit: Payer: Self-pay

## 2020-11-08 ENCOUNTER — Ambulatory Visit
Admission: EM | Admit: 2020-11-08 | Discharge: 2020-11-08 | Disposition: A | Discharge status: Home / Self Care | Payer: BC Managed Care – PPO | Attending: Sports Medicine | Admitting: Sports Medicine

## 2020-11-08 ENCOUNTER — Encounter: Payer: Self-pay | Admitting: Emergency Medicine

## 2020-11-08 DIAGNOSIS — R202 Paresthesia of skin: Secondary | ICD-10-CM | POA: Diagnosis not present

## 2020-11-08 DIAGNOSIS — M79609 Pain in unspecified limb: Secondary | ICD-10-CM

## 2020-11-08 DIAGNOSIS — M5412 Radiculopathy, cervical region: Secondary | ICD-10-CM | POA: Diagnosis not present

## 2020-11-08 DIAGNOSIS — M62838 Other muscle spasm: Secondary | ICD-10-CM | POA: Diagnosis not present

## 2020-11-08 MED ORDER — METHYLPREDNISOLONE SODIUM SUCC 125 MG IJ SOLR: 125.0000 mg | Freq: Once | INTRAMUSCULAR | 0 refills | Status: DC

## 2020-11-08 MED ORDER — CYCLOBENZAPRINE HCL 5 MG PO TABS: 5.0000 mg | ORAL_TABLET | Freq: Three times a day (TID) | ORAL | 0 refills | Status: AC | PRN

## 2020-11-08 MED ORDER — PREDNISONE 10 MG (21) PO TBPK: ORAL_TABLET | Freq: Every day | ORAL | 0 refills | Status: AC

## 2020-11-08 MED ORDER — METHOCARBAMOL 500 MG PO TABS: 500.0000 mg | ORAL_TABLET | Freq: Two times a day (BID) | ORAL | 0 refills | Status: AC

## 2020-11-08 MED ORDER — METHYLPREDNISOLONE SODIUM SUCC 125 MG IJ SOLR
125.0000 mg | Freq: Once | INTRAMUSCULAR | Status: AC
  Administered 2020-11-08: 125 mg via INTRAMUSCULAR

## 2020-11-08 NOTE — ED Triage Notes (Signed)
Pt c/o of left shoulder pain x 2 weeks. Said she just woke up in pain and was seen at [facility] on 10/30/20. Given muscle relaxers and Naproxen. She woke up today with increased pain. Also has Lidocaine patch on. Denies injury.

## 2020-11-08 NOTE — ED Provider Notes (Signed)
[facility]    CSN: [id number] Arrival date & time: 11/08/20  0825      History   Chief Complaint Chief Complaint  Patient presents with  . Shoulder Pain    (left)    HPI Catherine Stout is a 55 y.o. female.   Patient is a 55 year old female who presents for evaluation of the above issues.  Patient was seen on January 3 for similar symptoms.  Diagnosed with what appears to be some sort of cervical radiculopathy with associated "pinched nerve".  Patient was given Flexeril but she is run out of that.  She has been using over-the-counter anti-inflammatories.  She has not gone to physical therapy.  The referral is at home.  Denies any accidents trauma or falls.  Her symptoms acutely worsened this morning.  She drove here as she feels as though she is not able to go to work.  Asking for a work note.  She is having pain from the left side of her neck into the medial border of the scapula and down her left arm.  She has numbing in the median nerve distribution but it extends from the neck all the way down to the elbow and into the wrist.  She denies any vision changes.  She feels as though her left arm is weaker.  When I entered the room she is crying in a lot of pain.  She denies any chest pain or shortness of breath.  No COVID symptoms.     Past Medical History:  Diagnosis Date  . Thyroid disease   . Wears contact lenses     Patient Active Problem List   Diagnosis Date Noted  . Personal history of colonic polyps   . Polyp of sigmoid colon   . Benign neoplasm of descending colon     Past Surgical History:  Procedure Laterality Date  . COLONOSCOPY    . COLONOSCOPY WITH PROPOFOL N/A 10/12/2018   Procedure: COLONOSCOPY WITH PROPOFOL;  Surgeon: Lucilla Lame, MD;  Location: [place];  Service: Endoscopy;  Laterality: N/A;  . CRYOTHERAPY    . POLYPECTOMY N/A 10/12/2018   Procedure: POLYPECTOMY;  Surgeon: Lucilla Lame, MD;  Location: [place];  Service: Endoscopy;  Laterality: N/A;    OB History    Gravida  1   Para  1   Term  1   Preterm      AB      Living  1     SAB      IAB      Ectopic      Multiple      Live Births               Home Medications    Prior to Admission medications   Medication Sig Start Date End Date Taking? Authorizing Provider  Cholecalciferol (VITAMIN D3 PO) Take by mouth daily.   Yes [provider]  levothyroxine (SYNTHROID, LEVOTHROID) 125 MCG tablet TAKE ONE TABLET EVERY OTHER DAY (ALTERNATING WITH LEVOTHYROXINE 137 MCG) 06/27/18  Yes [provider]  methocarbamol (ROBAXIN) 500 MG tablet Take 1 tablet (500 mg total) by mouth 2 (two) times daily. 11/08/20  Yes Verda Cumins, MD  methylPREDNISolone sodium succinate (SOLU-MEDROL) 125 mg/2 mL injection Inject 2 mLs (125 mg total) into the muscle once for 1 dose. 11/08/20 11/08/20 Yes Verda Cumins, MD  Multiple Vitamin (MULTIVITAMIN) tablet Take 1 tablet by mouth daily.   Yes [provider]  naproxen (NAPROSYN)  250 MG tablet Take 250 mg by mouth 2 (two) times daily with a meal.   Yes [provider]  predniSONE (STERAPRED UNI-PAK 21 TAB) 10 MG (21) TBPK tablet Take by mouth daily. Take 6 tabs by mouth daily  for 2 days, then 5 tabs for 2 days, then 4 tabs for 2 days, then 3 tabs for 2 days, 2 tabs for 2 days, then 1 tab by mouth daily for 2 days 11/08/20  Yes Verda Cumins, MD  cyclobenzaprine (FLEXERIL) 5 MG tablet Take 1 tablet (5 mg total) by mouth 3 (three) times daily as needed for muscle spasms. 11/08/20   Verda Cumins, MD    Family History Family History  Problem Relation Age of Onset  . Hypertension Maternal Grandfather   . Diabetes Maternal Grandfather   . Cancer Paternal Grandmother     Social History Social History   Tobacco Use  . Smoking status: Current Every Day Smoker    Packs/day: 0.66    Years: 38.00    Pack years: 25.08    Types: Cigarettes  . Smokeless  tobacco: Never Used  . Tobacco comment: since age 51  Vaping Use  . Vaping Use: Never used  Substance Use Topics  . Alcohol use: Yes    Alcohol/week: 4.0 standard drinks    Types: 2 Glasses of wine, 2 Standard drinks or equivalent per week  . Drug use: Never     Allergies   Patient has no known allergies.   Review of Systems Review of Systems  Constitutional: Negative for chills, fatigue and fever.  HENT: Negative for congestion, postnasal drip, rhinorrhea, sinus pressure, sinus pain and sore throat.   Eyes: Negative for pain.  Respiratory: Negative for cough, chest tightness, shortness of breath, wheezing and stridor.   Cardiovascular: Negative for chest pain and palpitations.  Gastrointestinal: Negative for abdominal pain.  Genitourinary: Negative for dysuria.  Musculoskeletal: Positive for arthralgias, neck pain and neck stiffness.  Skin: Negative for color change, pallor, rash and wound.  Neurological: Negative for syncope, weakness, light-headedness and headaches.  All other systems reviewed and are negative.    Physical Exam Triage Vital Signs ED Triage Vitals  Enc Vitals Group     BP 11/08/20 0856 110/74     Pulse Rate 11/08/20 0856 85     Resp 11/08/20 0856 16     Temp 11/08/20 0856 98.2 F (36.8 C)     Temp src --      SpO2 11/08/20 0856 100 %     Weight --      Height --      Head Circumference --      Peak Flow --      Pain Score 11/08/20 0850 10     Pain Loc --      Pain Edu? --      Excl. in [place]? --    No data found.  Updated Vital Signs BP 110/74 (BP Location: Right Arm)   Pulse 85   Temp 98.2 F (36.8 C)   Resp 16   SpO2 100%   Visual Acuity Right Eye Distance:   Left Eye Distance:   Bilateral Distance:    Right Eye Near:   Left Eye Near:    Bilateral Near:     Physical Exam Vitals and nursing note reviewed.  Constitutional:      Comments: Crying throughout the history and physical exam.  HENT:     Head: Normocephalic and  atraumatic.  Eyes:  Extraocular Movements: Extraocular movements intact.     Conjunctiva/sclera: Conjunctivae normal.     Pupils: Pupils are equal, round, and reactive to light.  Cardiovascular:     Rate and Rhythm: Normal rate and regular rhythm.     Pulses: Normal pulses.     Heart sounds: Normal heart sounds. No murmur heard. No friction rub. No gallop.   Pulmonary:     Effort: Pulmonary effort is normal.     Breath sounds: Normal breath sounds.  Musculoskeletal:     Comments: Cervical spine: Patient is tender to palpation in the left and right trapezius, left greater than right.  There is a some associated spasm.  Her tenderness goes from the base of the occiput into the trapezius and down to the medial scapular border on the left.  It is a very limited exam.  Her range of motion is limited with flexion extension lateral side bends and rotation.  Extension does accentuate her pain down her arm with associated paresthesia.  Positive Spurling's test.  Strength seems preserved although it is effort dependent both in the neck and the bilateral upper extremities.  She has a positive Phalens test.  She has had associated paresthesia but sensation is intact to both light and deep touch.  Reflexes are 1-2+ bilaterally.  I do not appreciate any deficit.  She has 2+ pulses distally.  Skin:    General: Skin is warm and dry.     Capillary Refill: Capillary refill takes 2 to 3 seconds.  Neurological:     Mental Status: She is alert.      UC Treatments / Results  Labs (all labs ordered are listed, but only abnormal results are displayed) Labs Reviewed - No data to display  EKG   Radiology No results found.  Procedures Procedures (including critical care time)  Medications Ordered in UC Medications - No data to display  Initial Impression / Assessment and Plan / UC Course  I have reviewed the triage vital signs and the nursing notes.  Pertinent labs & imaging results that were  available during my care of the patient were reviewed by me and considered in my medical decision making (see chart for details).  Clinical impression: 55 year old with left-sided neck pain associated muscle spasm and paresthesia into the left arm.  Seems most consistent with a C3-4, C5-6, and potentially C6-7 involvement.  We will treat for cervical radiculopathy accordingly.  Treatment plan: 1.  The findings and treatment plan were discussed in detail with the patient.  Patient was in agreement. 2.  We will give her 125 mg of Solu-Medrol IM in the office right now. 3.  We will give her a Sterapred Dosepak to begin tomorrow. 4.  We will renew her Flexeril. 5.  Warned her not to take anti-inflammatories while she is on steroids.  She can take Tylenol as needed. 6.  We will refer her to orthopedics.  Ultimately she will need an MRI.  I contemplated getting an x-ray today but without trauma I do not feel it is necessary.  Orthopedics will probably want their own views. 7.  I gave her a work note we will just keep her out this week. 8.  She has a physical therapy referral and I have encouraged her to contact the physical therapy places here locally and get in for treatment as ultimately that we will assist her more than anything. 9.  Went over the red flag signs and symptoms.  Certainly if she is having  chest pain shortness of breath or her numbness or tingling or pain was to get worse then she should go directly to the ER or call 911. 10.  Follow-up here as needed.    Final Clinical Impressions(s) / UC Diagnoses   Final diagnoses:  Radiculitis of left cervical region  Paresthesia and pain of left extremity  Muscle spasm     Discharge Instructions     We will give her 125 mg of Solu-Medrol IM in the office right now. We will give her a Sterapred Dosepak to begin tomorrow. We will renew her Flexeril. Warned her not to take anti-inflammatories while she is on steroids.  She can take Tylenol  as needed. We will refer her to orthopedics.  Ultimately she will need an MRI.  I contemplated getting an x-ray today but without trauma I do not feel it is necessary.  Orthopedics will probably want their own views. I gave her a work note we will just keep her out this week. She has a physical therapy referral and I have encouraged her to contact the physical therapy places here locally and get in for treatment as ultimately that we will assist her more than anything. Went over the red flag signs and symptoms.  Certainly if she is having chest pain shortness of breath or her numbness or tingling or pain was to get worse then she should go directly to the ER or call 911. Follow-up here as needed.    ED Prescriptions    Medication Sig Dispense Auth. Provider   methylPREDNISolone sodium succinate (SOLU-MEDROL) 125 mg/2 mL injection Inject 2 mLs (125 mg total) into the muscle once for 1 dose. 1 each Verda Cumins, MD   cyclobenzaprine (FLEXERIL) 5 MG tablet Take 1 tablet (5 mg total) by mouth 3 (three) times daily as needed for muscle spasms. 60 tablet Verda Cumins, MD   predniSONE (STERAPRED UNI-PAK 21 TAB) 10 MG (21) TBPK tablet Take by mouth daily. Take 6 tabs by mouth daily  for 2 days, then 5 tabs for 2 days, then 4 tabs for 2 days, then 3 tabs for 2 days, 2 tabs for 2 days, then 1 tab by mouth daily for 2 days 42 tablet Verda Cumins, MD   methocarbamol (ROBAXIN) 500 MG tablet Take 1 tablet (500 mg total) by mouth 2 (two) times daily. 20 tablet Verda Cumins, MD     PDMP not reviewed this encounter.   Verda Cumins, MD 11/08/20 1027

## 2020-11-08 NOTE — Discharge Instructions (Addendum)
We will give her 125 mg of Solu-Medrol IM in the office right now. We will give her a Sterapred Dosepak to begin tomorrow. We will renew her Flexeril. Warned her not to take anti-inflammatories while she is on steroids.  She can take Tylenol as needed. We will refer her to orthopedics.  Ultimately she will need an MRI.  I contemplated getting an x-ray today but without trauma I do not feel it is necessary.  Orthopedics will probably want their own views. I gave her a work note we will just keep her out this week. She has a physical therapy referral and I have encouraged her to contact the physical therapy places here locally and get in for treatment as ultimately that we will assist her more than anything. Went over the red flag signs and symptoms.  Certainly if she is having chest pain shortness of breath or her numbness or tingling or pain was to get worse then she should go directly to the ER or call 911. Follow-up here as needed.

## 2021-08-15 ENCOUNTER — Other Ambulatory Visit: Payer: Self-pay | Admitting: Pediatrics

## 2021-08-15 DIAGNOSIS — Z139 Encounter for screening, unspecified: Secondary | ICD-10-CM

## 2021-08-17 ENCOUNTER — Other Ambulatory Visit: Payer: Self-pay

## 2021-08-17 ENCOUNTER — Ambulatory Visit
Admission: RE | Admit: 2021-08-17 | Discharge: 2021-08-17 | Disposition: A | Discharge status: Home / Self Care | Payer: BC Managed Care – PPO | Source: Ambulatory Visit | Attending: Pediatrics | Admitting: Pediatrics

## 2021-08-17 DIAGNOSIS — Z139 Encounter for screening, unspecified: Secondary | ICD-10-CM

## 2022-07-22 ENCOUNTER — Other Ambulatory Visit: Payer: Self-pay | Admitting: Pediatrics

## 2022-07-22 DIAGNOSIS — Z1231 Encounter for screening mammogram for malignant neoplasm of breast: Secondary | ICD-10-CM

## 2022-07-24 ENCOUNTER — Ambulatory Visit
Admission: RE | Admit: 2022-07-24 | Discharge: 2022-07-24 | Disposition: A | Discharge status: Home / Self Care | Payer: BC Managed Care – PPO | Source: Ambulatory Visit | Attending: Pediatrics | Admitting: Pediatrics

## 2022-07-24 DIAGNOSIS — Z1231 Encounter for screening mammogram for malignant neoplasm of breast: Secondary | ICD-10-CM

## 2023-06-18 IMAGING — MG MM DIGITAL SCREENING BILAT W/ TOMO AND CAD
8 series · 8 of 24 positions shown · non-contrast
Comparison: Previous exam(s).

CLINICAL DATA: Screening.

EXAM:
DIGITAL SCREENING BILATERAL MAMMOGRAM WITH TOMOSYNTHESIS AND CAD
TECHNIQUE: Bilateral screening digital craniocaudal and mediolateral oblique
mammograms were obtained. Bilateral screening digital breast
tomosynthesis was performed. The images were evaluated with
computer-aided detection.

[L CC synth-2D]
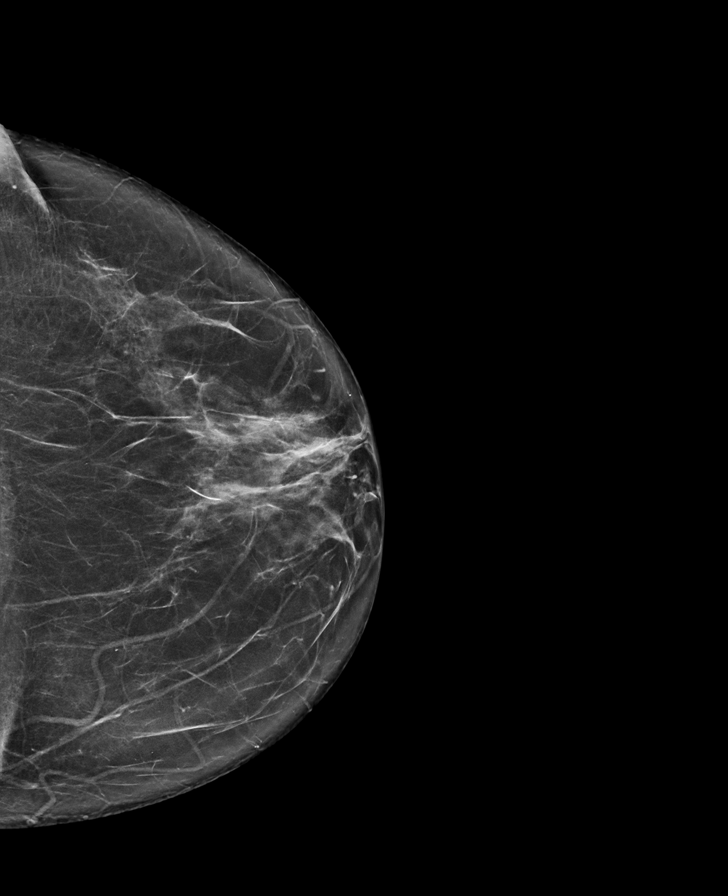

[R CC synth-2D]
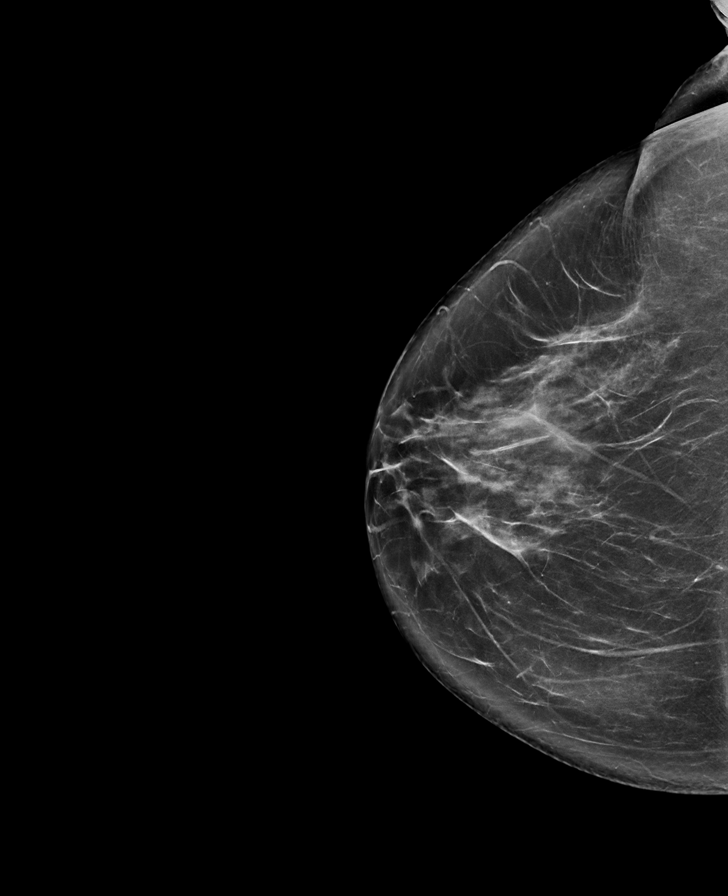

[R MLO synth-2D]
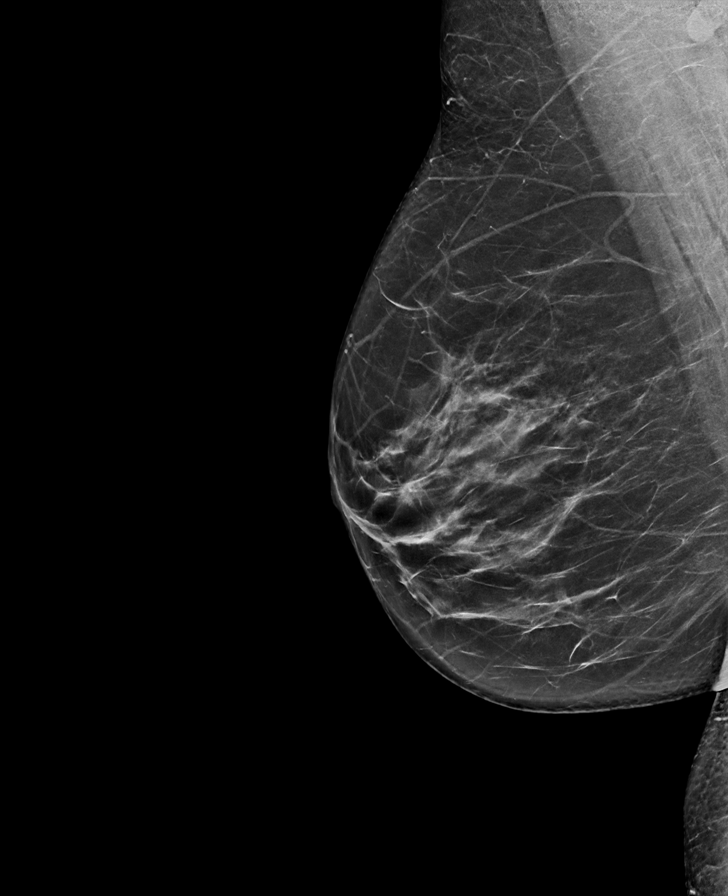

[L MLO synth-2D]
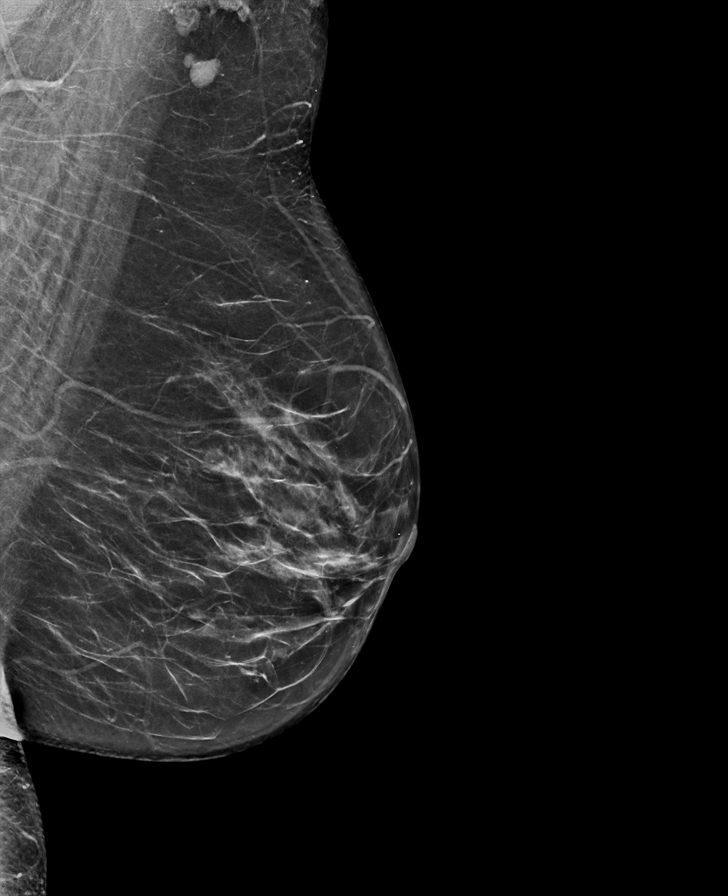

[R CC tomo · tomo slice 41/82.0]
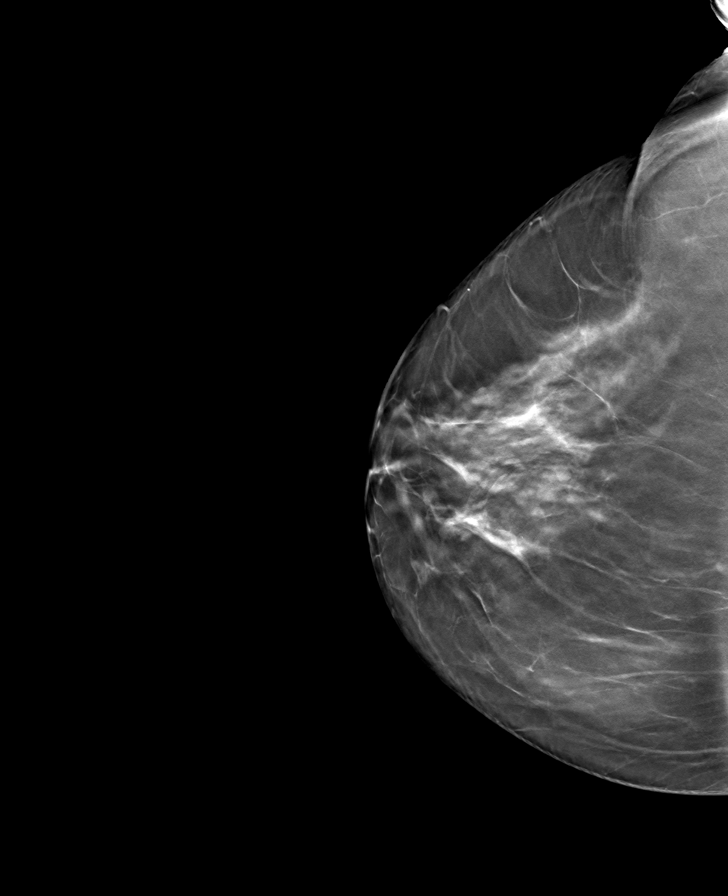

[L CC tomo · tomo slice 37/72.0]
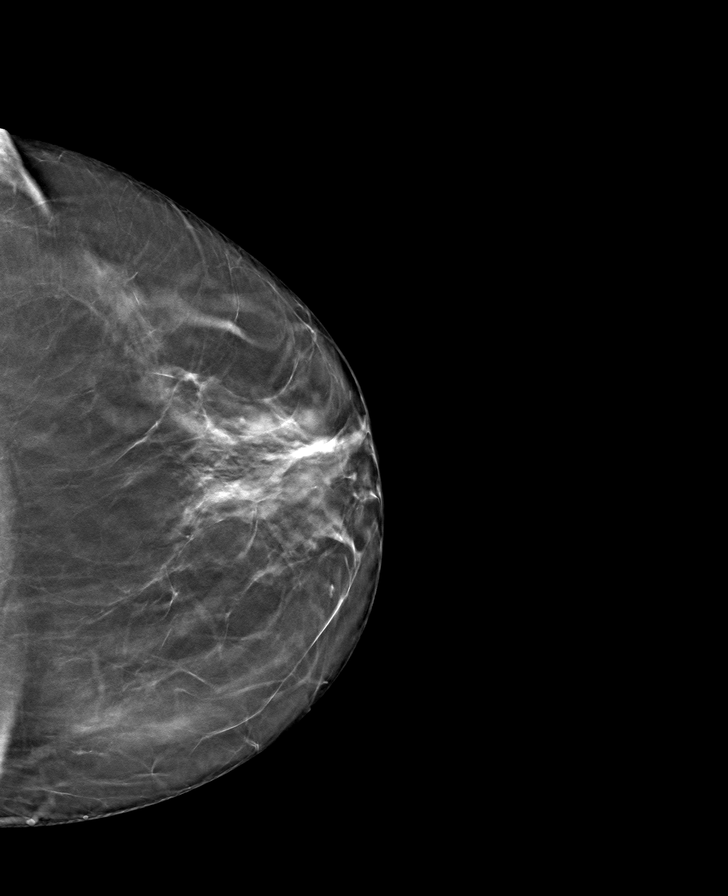

[L MLO tomo · tomo slice 37/73.0]
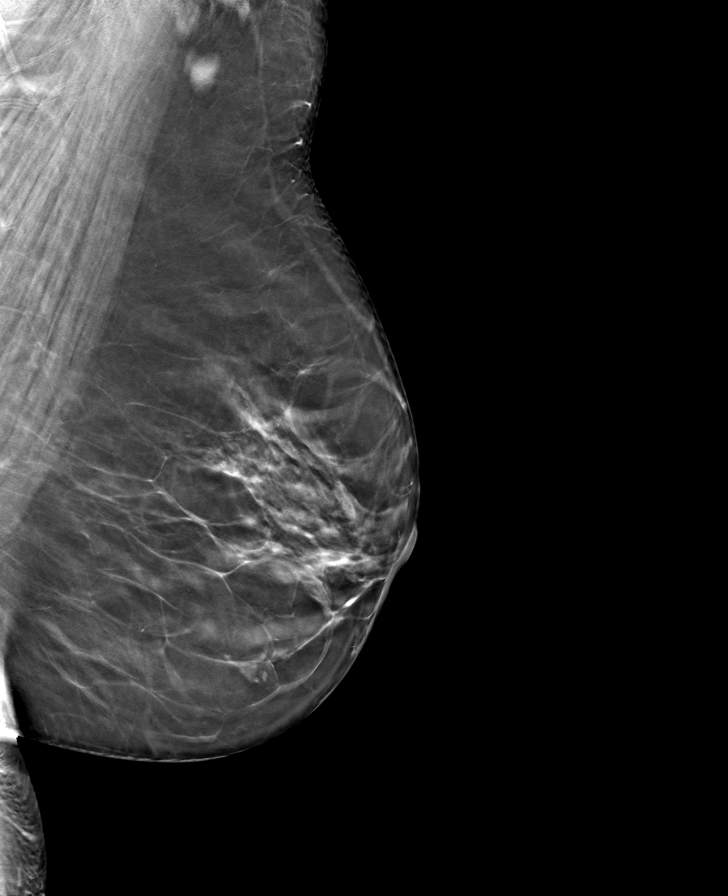

[R MLO tomo · tomo slice 39/76.0]
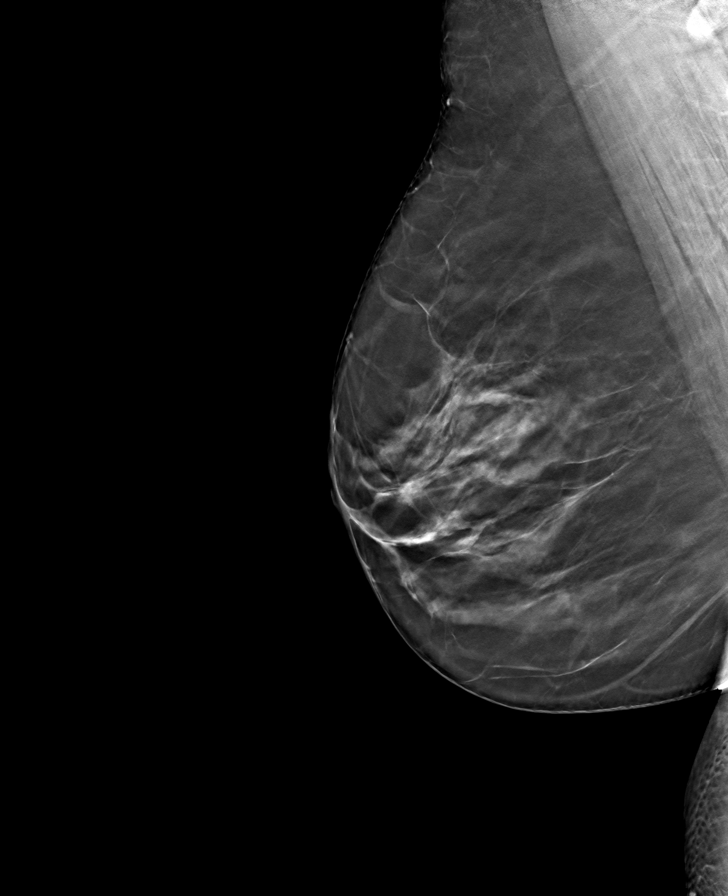

[8 of 24 positions shown; findings below may reference images not displayed]

ACR Breast Density Category b: There are scattered areas of
fibroglandular density.
FINDINGS: There are no findings suspicious for malignancy.
IMPRESSION: No mammographic evidence of malignancy. A result letter of this
screening mammogram will be mailed directly to the patient.

RECOMMENDATION:
Screening mammogram in one year. (Code:51-O-LD2)

BI-RADS CATEGORY  1: Negative.

## 2023-07-09 ENCOUNTER — Other Ambulatory Visit: Payer: Self-pay | Admitting: Pediatrics

## 2023-07-09 DIAGNOSIS — Z1231 Encounter for screening mammogram for malignant neoplasm of breast: Secondary | ICD-10-CM

## 2023-07-21 ENCOUNTER — Ambulatory Visit
Admission: RE | Admit: 2023-07-21 | Discharge: 2023-07-21 | Disposition: A | Discharge status: Home / Self Care | Payer: BC Managed Care – PPO | Source: Ambulatory Visit | Attending: Pediatrics | Admitting: Pediatrics

## 2023-07-21 DIAGNOSIS — Z1231 Encounter for screening mammogram for malignant neoplasm of breast: Secondary | ICD-10-CM

## 2023-11-25 ENCOUNTER — Telehealth: Payer: Self-pay

## 2023-11-25 ENCOUNTER — Other Ambulatory Visit: Payer: Self-pay

## 2023-11-25 DIAGNOSIS — Z860100 Personal history of colon polyps, unspecified: Secondary | ICD-10-CM

## 2023-11-25 MED ORDER — NA SULFATE-K SULFATE-MG SULF 17.5-3.13-1.6 GM/177ML PO SOLN: 1.0000 | Freq: Once | ORAL | 0 refills | Status: AC

## 2023-11-25 NOTE — Telephone Encounter (Signed)
Patient is calling to schedule her colonoscopy that we received a referral for

## 2023-11-25 NOTE — Telephone Encounter (Signed)
Call back to patient to r/s colonoscopy.  Colonoscopy date has been changed to 12/30/23.  Endo notified.  Instructions updated.  Referral updated.  Thanks, [place], [place]

## 2023-11-25 NOTE — Telephone Encounter (Signed)
 Gastroenterology Pre-Procedure Review  Request Date: 12/23/23 Requesting Physician: Dr. Jinny  PATIENT REVIEW QUESTIONS: The patient responded to the following health history questions as indicated:    1. Are you having any GI issues? no 2. Do you have a personal history of Polyps? yes (last colonoscopy performed with Dr. Jinny 10/12/2018) 3. Do you have a family history of Colon Cancer or Polyps? no 4. Diabetes Mellitus? no 5. Joint replacements in the past 12 months?no 6. Major health problems in the past 3 months?no 7. Any artificial heart valves, MVP, or defibrillator?no    MEDICATIONS & ALLERGIES:    Patient reports the following regarding taking any anticoagulation/antiplatelet therapy:   Plavix, Coumadin, Eliquis, Xarelto, Lovenox, Pradaxa, Brilinta, or Effient? no Aspirin? no  Patient confirms/reports the following medications:  Current Outpatient Medications  Medication Sig Dispense Refill   Cholecalciferol (VITAMIN D3 PO) Take by mouth daily.     cyclobenzaprine  (FLEXERIL ) 5 MG tablet Take 1 tablet (5 mg total) by mouth 3 (three) times daily as needed for muscle spasms. 60 tablet 0   levothyroxine (SYNTHROID, LEVOTHROID) 125 MCG tablet TAKE ONE TABLET EVERY OTHER DAY (ALTERNATING WITH LEVOTHYROXINE 137 MCG)  1   methocarbamol  (ROBAXIN ) 500 MG tablet Take 1 tablet (500 mg total) by mouth 2 (two) times daily. 20 tablet 0   Multiple Vitamin (MULTIVITAMIN) tablet Take 1 tablet by mouth daily.     naproxen (NAPROSYN) 250 MG tablet Take 250 mg by mouth 2 (two) times daily with a meal.     predniSONE  (STERAPRED UNI-PAK 21 TAB) 10 MG (21) TBPK tablet Take by mouth daily. Take 6 tabs by mouth daily  for 2 days, then 5 tabs for 2 days, then 4 tabs for 2 days, then 3 tabs for 2 days, 2 tabs for 2 days, then 1 tab by mouth daily for 2 days 42 tablet 0   No current facility-administered medications for this visit.    Patient confirms/reports the following allergies:  No Known  Allergies  No orders of the defined types were placed in this encounter.   AUTHORIZATION INFORMATION Primary Insurance: 1D#: Group #:  Secondary Insurance: 1D#: Group #:  SCHEDULE INFORMATION: Date: 12/23/23 Time: Location: [facility]

## 2023-11-25 NOTE — Telephone Encounter (Signed)
Per pt phone call would like to change date of her procedure . [phone]

## 2023-12-17 ENCOUNTER — Other Ambulatory Visit: Payer: Self-pay | Admitting: Otolaryngology

## 2023-12-17 DIAGNOSIS — E079 Disorder of thyroid, unspecified: Secondary | ICD-10-CM

## 2023-12-30 ENCOUNTER — Ambulatory Visit: Admitting: Certified Registered"

## 2023-12-30 ENCOUNTER — Other Ambulatory Visit: Payer: Self-pay

## 2023-12-30 ENCOUNTER — Ambulatory Visit
Admission: RE | Admit: 2023-12-30 | Discharge: 2023-12-30 | Disposition: A | Discharge status: Home / Self Care | Payer: BC Managed Care – PPO | Attending: Gastroenterology | Admitting: Gastroenterology

## 2023-12-30 ENCOUNTER — Encounter: Payer: Self-pay | Admitting: Gastroenterology

## 2023-12-30 ENCOUNTER — Encounter
Admission: RE | Disposition: A | Discharge status: Home / Self Care | Payer: Self-pay | Source: Home / Self Care | Attending: Gastroenterology

## 2023-12-30 DIAGNOSIS — Z1211 Encounter for screening for malignant neoplasm of colon: Secondary | ICD-10-CM | POA: Diagnosis not present

## 2023-12-30 DIAGNOSIS — K640 First degree hemorrhoids: Secondary | ICD-10-CM | POA: Diagnosis not present

## 2023-12-30 DIAGNOSIS — D122 Benign neoplasm of ascending colon: Secondary | ICD-10-CM | POA: Diagnosis not present

## 2023-12-30 DIAGNOSIS — K635 Polyp of colon: Secondary | ICD-10-CM | POA: Diagnosis not present

## 2023-12-30 DIAGNOSIS — Z79890 Hormone replacement therapy (postmenopausal): Secondary | ICD-10-CM | POA: Insufficient documentation

## 2023-12-30 DIAGNOSIS — K5730 Diverticulosis of large intestine without perforation or abscess without bleeding: Secondary | ICD-10-CM | POA: Insufficient documentation

## 2023-12-30 DIAGNOSIS — Z791 Long term (current) use of non-steroidal anti-inflammatories (NSAID): Secondary | ICD-10-CM | POA: Insufficient documentation

## 2023-12-30 DIAGNOSIS — D120 Benign neoplasm of cecum: Secondary | ICD-10-CM | POA: Insufficient documentation

## 2023-12-30 DIAGNOSIS — F17210 Nicotine dependence, cigarettes, uncomplicated: Secondary | ICD-10-CM | POA: Diagnosis not present

## 2023-12-30 DIAGNOSIS — Z7952 Long term (current) use of systemic steroids: Secondary | ICD-10-CM | POA: Insufficient documentation

## 2023-12-30 DIAGNOSIS — E079 Disorder of thyroid, unspecified: Secondary | ICD-10-CM | POA: Diagnosis not present

## 2023-12-30 DIAGNOSIS — Z860100 Personal history of colon polyps, unspecified: Secondary | ICD-10-CM

## 2023-12-30 DIAGNOSIS — D125 Benign neoplasm of sigmoid colon: Secondary | ICD-10-CM | POA: Diagnosis not present

## 2023-12-30 HISTORY — PX: COLONOSCOPY WITH PROPOFOL: SHX5780

## 2023-12-30 HISTORY — PX: POLYPECTOMY: SHX5525

## 2023-12-30 SURGERY — COLONOSCOPY WITH PROPOFOL
Anesthesia: General

## 2023-12-30 MED ORDER — MIDAZOLAM HCL 5 MG/5ML IJ SOLN
INTRAMUSCULAR | Status: DC | PRN
  Administered 2023-12-30: 2 mg via INTRAVENOUS

## 2023-12-30 MED ORDER — SODIUM CHLORIDE 0.9% FLUSH: 3.0000 mL | INTRAVENOUS | Status: DC | PRN

## 2023-12-30 MED ORDER — PROPOFOL 10 MG/ML IV BOLUS
INTRAVENOUS | Status: DC | PRN
  Administered 2023-12-30: 100 mg via INTRAVENOUS

## 2023-12-30 MED ORDER — SODIUM CHLORIDE 0.9 % IV SOLN: INTRAVENOUS | Status: DC

## 2023-12-30 MED ORDER — PROPOFOL 500 MG/50ML IV EMUL
INTRAVENOUS | Status: DC | PRN
  Administered 2023-12-30: 125 ug/kg/min via INTRAVENOUS

## 2023-12-30 MED ORDER — SODIUM CHLORIDE 0.9% FLUSH: 3.0000 mL | Freq: Two times a day (BID) | INTRAVENOUS | Status: DC

## 2023-12-30 MED ORDER — MIDAZOLAM HCL 2 MG/2ML IJ SOLN
INTRAMUSCULAR | Status: AC
  Filled 2023-12-30: qty 2

## 2023-12-30 NOTE — Op Note (Signed)
 [facility] Gastroenterology Patient Name: Catherine Stout Procedure Date: 12/30/2023 9:14 AM MRN: [id number] Account #: [id number] Date of Birth: [birth year] Admit Type: Outpatient Age: 58 Room: [facility] ENDO ROOM 4 Gender: Female Note Status: Finalized Instrument Name: Prentice Docker [id number] Procedure:             Colonoscopy Indications:           High risk colon cancer surveillance: Personal history                         of colonic polyps Providers:             Midge Minium MD, MD Referring MD:          Daniel Nones, MD (Referring MD) Medicines:             Propofol per Anesthesia Complications:         No immediate complications. Procedure:             Pre-Anesthesia Assessment:                        - Prior to the procedure, a History and Physical was                         performed, and patient medications and allergies were                         reviewed. The patient's tolerance of previous                         anesthesia was also reviewed. The risks and benefits                         of the procedure and the sedation options and risks                         were discussed with the patient. All questions were                         answered, and informed consent was obtained. Prior                         Anticoagulants: The patient has taken no anticoagulant                         or antiplatelet agents. ASA Grade Assessment: II - A                         patient with mild systemic disease. After reviewing                         the risks and benefits, the patient was deemed in                         satisfactory condition to undergo the procedure.                        After obtaining informed consent, the colonoscope was  passed under direct vision. Throughout the procedure,                         the patient's blood pressure, pulse, and oxygen                         saturations were monitored continuously. The                          Colonoscope was introduced through the anus and                         advanced to the the cecum, identified by appendiceal                         orifice and ileocecal valve. The colonoscopy was                         performed without difficulty. The patient tolerated                         the procedure well. The quality of the bowel                         preparation was excellent. Findings:      The perianal and digital rectal examinations were normal.      A 2 mm polyp was found in the cecum. The polyp was sessile. The polyp       was removed with a cold snare. Resection and retrieval were complete.      A 3 mm polyp was found in the ascending colon. The polyp was sessile.       The polyp was removed with a cold snare. Resection and retrieval were       complete.      Four sessile polyps were found in the sigmoid colon. The polyps were 3       to 6 mm in size. These polyps were removed with a cold snare. Resection       and retrieval were complete.      Non-bleeding internal hemorrhoids were found during retroflexion. The       hemorrhoids were Grade I (internal hemorrhoids that do not prolapse).      Multiple small-mouthed diverticula were found in the sigmoid colon. Impression:            - One 2 mm polyp in the cecum, removed with a cold                         snare. Resected and retrieved.                        - One 3 mm polyp in the ascending colon, removed with                         a cold snare. Resected and retrieved.                        - Four 3 to 6 mm polyps in the sigmoid colon, removed  with a cold snare. Resected and retrieved.                        - Non-bleeding internal hemorrhoids.                        - Diverticulosis in the sigmoid colon. Recommendation:        - Discharge patient to home.                        - Resume previous diet.                        - Continue present medications.                         - Await pathology results.                        - Repeat colonoscopy in 3 years for surveillance. Procedure Code(s):     --- Professional ---                        [phone], Colonoscopy, flexible; with removal of                         tumor(s), polyp(s), or other lesion(s) by snare                         technique Diagnosis Code(s):     --- Professional ---                        Z86.010, Personal history of colonic polyps                        D12.5, Benign neoplasm of sigmoid colon CPT copyright 2022 [employer]. All rights reserved. The codes documented in this report are preliminary and upon coder review may  be revised to meet current compliance requirements. Midge Minium MD, MD 12/30/2023 9:36:43 AM This report has been signed electronically. Number of Addenda: 0 Note Initiated On: 12/30/2023 9:14 AM Scope Withdrawal Time: 0 hours 12 minutes 25 seconds  Total Procedure Duration: 0 hours 16 minutes 6 seconds  Estimated Blood Loss:  Estimated blood loss: none.      [facility]

## 2023-12-30 NOTE — H&P (Signed)
 Catherine Minium, MD [facility] [address], [address] [place], [place] [address] Phone:[phone] Fax : [phone]  Primary Care Physician:  Catherine Desanctis, MD Primary Gastroenterologist:  Catherine Stout  Pre-Procedure History & Physical: HPI:  Catherine Stout is a 58 y.o. female is here for an colonoscopy.   Past Medical History:  Diagnosis Date   Thyroid disease    Wears contact lenses     Past Surgical History:  Procedure Laterality Date   COLONOSCOPY     COLONOSCOPY WITH PROPOFOL N/A 10/12/2018   Procedure: COLONOSCOPY WITH PROPOFOL;  Surgeon: Catherine Minium, MD;  Location: [facility];  Service: Endoscopy;  Laterality: N/A;   CRYOTHERAPY     POLYPECTOMY N/A 10/12/2018   Procedure: POLYPECTOMY;  Surgeon: Catherine Minium, MD;  Location: [facility];  Service: Endoscopy;  Laterality: N/A;    Prior to Admission medications   Medication Sig Start Date End Date Taking? Authorizing Provider  Cholecalciferol (VITAMIN D3 PO) Take by mouth daily.   Yes [provider]  levothyroxine (SYNTHROID, LEVOTHROID) 125 MCG tablet TAKE ONE TABLET EVERY OTHER DAY (ALTERNATING WITH LEVOTHYROXINE 137 MCG) 06/27/18  Yes [provider]  Multiple Vitamin (MULTIVITAMIN) tablet Take 1 tablet by mouth daily.   Yes [provider]  cyclobenzaprine (FLEXERIL) 5 MG tablet Take 1 tablet (5 mg total) by mouth 3 (three) times daily as needed for muscle spasms. 11/08/20   Catherine See, MD  methocarbamol (ROBAXIN) 500 MG tablet Take 1 tablet (500 mg total) by mouth 2 (two) times daily. 11/08/20   Catherine See, MD  naproxen (NAPROSYN) 250 MG tablet Take 250 mg by mouth 2 (two) times daily with a meal.    [provider]  predniSONE (STERAPRED UNI-PAK 21 TAB) 10 MG (21) TBPK tablet Take by mouth daily. Take 6 tabs by mouth daily  for 2 days, then 5 tabs for 2 days, then 4 tabs for 2 days, then 3 tabs for 2 days, 2 tabs for 2 days, then 1 tab by mouth daily for 2  days Patient not taking: Reported on 12/30/2023 11/08/20   Catherine See, MD    Allergies as of 11/25/2023   (No Known Allergies)    Family History  Problem Relation Age of Onset   Hypertension Maternal Grandfather    Diabetes Maternal Grandfather    Cancer Paternal Grandmother    Breast cancer Neg Hx     Social History   Socioeconomic History   Marital status: Divorced    Spouse name: Not on file   Number of children: Not on file   Years of education: Not on file   Highest education level: Not on file  Occupational History   Not on file  Tobacco Use   Smoking status: Every Day    Current packs/day: 0.66    Average packs/day: 0.7 packs/day for 38.0 years (25.1 ttl pk-yrs)    Types: Cigarettes   Smokeless tobacco: Never   Tobacco comments:    since age 48  Vaping Use   Vaping status: Never Used  Substance and Sexual Activity   Alcohol use: Yes    Alcohol/week: 4.0 standard drinks of alcohol    Types: 2 Glasses of wine, 2 Standard drinks or equivalent per week   Drug use: Never   Sexual activity: Yes  Other Topics Concern   Not on file  Social History Narrative   Not on file   Social Drivers of Health   Financial Resource Strain: Low Risk  (09/16/2023)  Received from [facility]   Overall Financial Resource Strain (CARDIA)    Difficulty of Paying Living Expenses: Not hard at all  Food Insecurity: No Food Insecurity (09/16/2023)   Received from [facility]   Hunger Vital Sign    Worried About Running Out of Food in the Last Year: Never true    Ran Out of Food in the Last Year: Never true  Transportation Needs: No Transportation Needs (09/16/2023)   Received from [facility] - Transportation    In the past 12 months, has lack of transportation kept you from medical appointments or from getting medications?: No    Lack of Transportation (Non-Medical): No  Physical Activity: Not on file  Stress:  Not on file  Social Connections: Not on file  Intimate Partner Violence: Not on file    Review of Systems: Stout HPI, otherwise negative ROS  Physical Exam: BP 130/79   Pulse 71   Temp (!) 96.9 F (36.1 C) (Temporal)   Resp 14   Ht 5\' 8"  (1.727 m)   Wt 106.6 kg   SpO2 98%   BMI 35.73 kg/m  General:   Alert,  pleasant and cooperative in NAD Head:  Normocephalic and atraumatic. Neck:  Supple; no masses or thyromegaly. Lungs:  Clear throughout to auscultation.    Heart:  Regular rate and rhythm. Abdomen:  Soft, nontender and nondistended. Normal bowel sounds, without guarding, and without rebound.   Neurologic:  Alert and  oriented x4;  grossly normal neurologically.  Impression/Plan: Catherine Stout is here for an colonoscopy to be performed for a history of adenomatous polyps on 2019   Risks, benefits, limitations, and alternatives regarding  colonoscopy have been reviewed with the patient.  Questions have been answered.  All parties agreeable.   Catherine Minium, MD  12/30/2023, 9:06 AM

## 2023-12-30 NOTE — Anesthesia Postprocedure Evaluation (Signed)
 Anesthesia Post Note  Patient: Catherine Stout  Procedure(s) Performed: COLONOSCOPY WITH PROPOFOL POLYPECTOMY  Patient location during evaluation: PACU Anesthesia Type: General Level of consciousness: awake and alert Pain management: pain level controlled Vital Signs Assessment: post-procedure vital signs reviewed and stable Respiratory status: spontaneous breathing, nonlabored ventilation, respiratory function stable and patient connected to nasal cannula oxygen Cardiovascular status: blood pressure returned to baseline and stable Postop Assessment: no apparent nausea or vomiting Anesthetic complications: no   No notable events documented.   Last Vitals:  Vitals:   12/30/23 0939 12/30/23 0959  BP: 94/74 121/75  Pulse:    Resp:    Temp: (!) 35.9 C   SpO2:      Last Pain:  Vitals:   12/30/23 0959  TempSrc:   PainSc: 0-No pain                 Yevette Edwards

## 2023-12-30 NOTE — Anesthesia Preprocedure Evaluation (Signed)
 Anesthesia Evaluation  Patient identified by MRN, date of birth, ID band Patient awake    Reviewed: Allergy & Precautions, H&P , NPO status , Patient's Chart, lab work & pertinent test results, reviewed documented beta blocker date and time   Airway Mallampati: II  TM Distance: >3 FB Neck ROM: full    Dental  (+) Teeth Intact   Pulmonary neg pulmonary ROS, Current Smoker   Pulmonary exam normal        Cardiovascular Exercise Tolerance: Good negative cardio ROS Normal cardiovascular exam Rhythm:regular Rate:Normal     Neuro/Psych negative neurological ROS  negative psych ROS   GI/Hepatic negative GI ROS, Neg liver ROS,,,  Endo/Other  negative endocrine ROS    Renal/GU negative Renal ROS  negative genitourinary   Musculoskeletal   Abdominal   Peds  Hematology negative hematology ROS (+)   Anesthesia Other Findings Past Medical History: No date: Thyroid disease No date: Wears contact lenses Past Surgical History: No date: COLONOSCOPY 10/12/2018: COLONOSCOPY WITH PROPOFOL; N/A     Comment:  Procedure: COLONOSCOPY WITH PROPOFOL;  Surgeon: Midge Minium, MD;  Location: [facility];  Service:               Endoscopy;  Laterality: N/A; No date: CRYOTHERAPY 10/12/2018: POLYPECTOMY; N/A     Comment:  Procedure: POLYPECTOMY;  Surgeon: Midge Minium, MD;                Location: [facility];  Service: Endoscopy;                Laterality: N/A; BMI    Body Mass Index: 35.73 kg/m     Reproductive/Obstetrics negative OB ROS                             Anesthesia Physical Anesthesia Plan  ASA: 2  Anesthesia Plan: General   Post-op Pain Management:    Induction:   PONV Risk Score and Plan:   Airway Management Planned:   Additional Equipment:   Intra-op Plan:   Post-operative Plan:   Informed Consent: I have reviewed the patients History and Physical,  chart, labs and discussed the procedure including the risks, benefits and alternatives for the proposed anesthesia with the patient or authorized representative who has indicated his/her understanding and acceptance.     Dental Advisory Given  Plan Discussed with: CRNA  Anesthesia Plan Comments:        Anesthesia Quick Evaluation

## 2023-12-30 NOTE — Transfer of Care (Signed)
 Immediate Anesthesia Transfer of Care Note  Patient: Catherine Stout  Procedure(s) Performed: COLONOSCOPY WITH PROPOFOL POLYPECTOMY  Patient Location: Endoscopy Unit  Anesthesia Type:General  Level of Consciousness: drowsy  Airway & Oxygen Therapy: Patient Spontanous Breathing  Post-op Assessment: Report given to RN and Post -op Vital signs reviewed and stable  Post vital signs: Reviewed  Last Vitals:  Vitals Value Taken Time  BP 94/74 12/30/23 0939  Temp    Pulse 65 12/30/23 0939  Resp 20 12/30/23 0939  SpO2 95 % 12/30/23 0939  Vitals shown include unfiled device data.  Last Pain:  Vitals:   12/30/23 0848  TempSrc: Temporal  PainSc: 0-No pain         Complications: No notable events documented.

## 2023-12-31 ENCOUNTER — Encounter: Payer: Self-pay | Admitting: Gastroenterology

## 2023-12-31 LAB — SURGICAL PATHOLOGY

## 2024-01-01 ENCOUNTER — Encounter: Payer: Self-pay | Admitting: Gastroenterology

## 2024-03-09 ENCOUNTER — Ambulatory Visit
Admission: RE | Admit: 2024-03-09 | Discharge: 2024-03-09 | Disposition: A | Discharge status: Home / Self Care | Payer: BC Managed Care – PPO | Source: Ambulatory Visit | Attending: Otolaryngology | Admitting: Otolaryngology

## 2024-03-09 DIAGNOSIS — E079 Disorder of thyroid, unspecified: Secondary | ICD-10-CM | POA: Insufficient documentation

## 2024-06-17 ENCOUNTER — Other Ambulatory Visit: Payer: Self-pay | Admitting: Pediatrics

## 2024-06-17 DIAGNOSIS — Z1231 Encounter for screening mammogram for malignant neoplasm of breast: Secondary | ICD-10-CM

## 2024-07-19 ENCOUNTER — Ambulatory Visit
Admission: RE | Admit: 2024-07-19 | Discharge: 2024-07-19 | Disposition: A | Discharge status: Home / Self Care | Payer: BC Managed Care – PPO | Source: Ambulatory Visit | Attending: Pediatrics | Admitting: Pediatrics

## 2024-07-19 DIAGNOSIS — Z1231 Encounter for screening mammogram for malignant neoplasm of breast: Secondary | ICD-10-CM
